# Patient Record
Sex: Male | Born: 1970 | Race: Black or African American | Hispanic: No | Marital: Married | Smoking: Never smoker
Health system: Southern US, Community
[De-identification: ages and names within clinical notes are randomized; demographics above are authoritative.]

## PROBLEM LIST (undated history)

## (undated) DIAGNOSIS — Z9889 Other specified postprocedural states: Secondary | ICD-10-CM

## (undated) DIAGNOSIS — A048 Other specified bacterial intestinal infections: Secondary | ICD-10-CM

## (undated) DIAGNOSIS — R112 Nausea with vomiting, unspecified: Secondary | ICD-10-CM

## (undated) DIAGNOSIS — F419 Anxiety disorder, unspecified: Secondary | ICD-10-CM

## (undated) DIAGNOSIS — F32A Depression, unspecified: Secondary | ICD-10-CM

## (undated) DIAGNOSIS — M722 Plantar fascial fibromatosis: Secondary | ICD-10-CM

## (undated) DIAGNOSIS — K589 Irritable bowel syndrome without diarrhea: Secondary | ICD-10-CM

## (undated) DIAGNOSIS — G4700 Insomnia, unspecified: Secondary | ICD-10-CM

## (undated) DIAGNOSIS — K602 Anal fissure, unspecified: Secondary | ICD-10-CM

## (undated) DIAGNOSIS — E785 Hyperlipidemia, unspecified: Secondary | ICD-10-CM

## (undated) DIAGNOSIS — Z8489 Family history of other specified conditions: Secondary | ICD-10-CM

## (undated) DIAGNOSIS — K317 Polyp of stomach and duodenum: Secondary | ICD-10-CM

## (undated) DIAGNOSIS — F329 Major depressive disorder, single episode, unspecified: Secondary | ICD-10-CM

## (undated) DIAGNOSIS — R51 Headache: Secondary | ICD-10-CM

## (undated) DIAGNOSIS — I10 Essential (primary) hypertension: Secondary | ICD-10-CM

## (undated) DIAGNOSIS — R7303 Prediabetes: Secondary | ICD-10-CM

## (undated) DIAGNOSIS — K219 Gastro-esophageal reflux disease without esophagitis: Secondary | ICD-10-CM

## (undated) HISTORY — DX: Prediabetes: R73.03

## (undated) HISTORY — PX: SPINE SURGERY: SHX786

## (undated) HISTORY — DX: Hyperlipidemia, unspecified: E78.5

## (undated) HISTORY — DX: Major depressive disorder, single episode, unspecified: F32.9

## (undated) HISTORY — PX: ROTATOR CUFF REPAIR: SHX139

## (undated) HISTORY — DX: Essential (primary) hypertension: I10

## (undated) HISTORY — DX: Anxiety disorder, unspecified: F41.9

## (undated) HISTORY — DX: Polyp of stomach and duodenum: K31.7

## (undated) HISTORY — PX: VASECTOMY: SHX75

## (undated) HISTORY — DX: Plantar fascial fibromatosis: M72.2

## (undated) HISTORY — PX: COLONOSCOPY: SHX174

## (undated) HISTORY — DX: Irritable bowel syndrome, unspecified: K58.9

## (undated) HISTORY — DX: Gastro-esophageal reflux disease without esophagitis: K21.9

## (undated) HISTORY — DX: Other specified bacterial intestinal infections: A04.8

## (undated) HISTORY — DX: Depression, unspecified: F32.A

## (undated) HISTORY — DX: Anal fissure, unspecified: K60.2

## (undated) HISTORY — DX: Insomnia, unspecified: G47.00

## (undated) HISTORY — DX: Headache: R51

---

## 1999-05-16 ENCOUNTER — Ambulatory Visit (HOSPITAL_BASED_OUTPATIENT_CLINIC_OR_DEPARTMENT_OTHER): Admission: RE | Admit: 1999-05-16 | Discharge: 1999-05-16 | Payer: Self-pay | Admitting: General Surgery

## 1999-06-28 ENCOUNTER — Ambulatory Visit (HOSPITAL_BASED_OUTPATIENT_CLINIC_OR_DEPARTMENT_OTHER): Admission: RE | Admit: 1999-06-28 | Discharge: 1999-06-28 | Payer: Self-pay | Admitting: General Surgery

## 2001-02-11 ENCOUNTER — Encounter (INDEPENDENT_AMBULATORY_CARE_PROVIDER_SITE_OTHER): Payer: Self-pay | Admitting: Specialist

## 2001-02-11 ENCOUNTER — Ambulatory Visit (HOSPITAL_COMMUNITY): Admission: RE | Admit: 2001-02-11 | Discharge: 2001-02-11 | Payer: Self-pay | Admitting: Urology

## 2001-09-08 HISTORY — PX: ANAL FISSURE REPAIR: SHX2312

## 2001-09-23 ENCOUNTER — Emergency Department (HOSPITAL_COMMUNITY): Admission: EM | Admit: 2001-09-23 | Discharge: 2001-09-23 | Payer: Self-pay | Admitting: Emergency Medicine

## 2004-01-23 ENCOUNTER — Emergency Department (HOSPITAL_COMMUNITY): Admission: EM | Admit: 2004-01-23 | Discharge: 2004-01-23 | Payer: Self-pay | Admitting: Emergency Medicine

## 2004-01-30 ENCOUNTER — Emergency Department (HOSPITAL_COMMUNITY): Admission: EM | Admit: 2004-01-30 | Discharge: 2004-01-30 | Payer: Self-pay | Admitting: Emergency Medicine

## 2005-03-08 DIAGNOSIS — K317 Polyp of stomach and duodenum: Secondary | ICD-10-CM

## 2005-03-08 HISTORY — DX: Polyp of stomach and duodenum: K31.7

## 2005-03-24 ENCOUNTER — Ambulatory Visit: Payer: Self-pay | Admitting: Internal Medicine

## 2005-03-31 ENCOUNTER — Ambulatory Visit: Payer: Self-pay | Admitting: Internal Medicine

## 2005-03-31 ENCOUNTER — Encounter (INDEPENDENT_AMBULATORY_CARE_PROVIDER_SITE_OTHER): Payer: Self-pay | Admitting: Specialist

## 2005-03-31 DIAGNOSIS — D131 Benign neoplasm of stomach: Secondary | ICD-10-CM | POA: Insufficient documentation

## 2007-01-08 ENCOUNTER — Emergency Department (HOSPITAL_COMMUNITY): Admission: EM | Admit: 2007-01-08 | Discharge: 2007-01-09 | Payer: Self-pay | Admitting: Emergency Medicine

## 2007-06-10 ENCOUNTER — Encounter: Payer: Self-pay | Admitting: Internal Medicine

## 2007-07-15 ENCOUNTER — Encounter: Payer: Self-pay | Admitting: Internal Medicine

## 2007-08-03 ENCOUNTER — Ambulatory Visit: Payer: Self-pay | Admitting: Internal Medicine

## 2007-09-29 DIAGNOSIS — F411 Generalized anxiety disorder: Secondary | ICD-10-CM | POA: Insufficient documentation

## 2007-09-29 DIAGNOSIS — K5900 Constipation, unspecified: Secondary | ICD-10-CM | POA: Insufficient documentation

## 2007-09-29 DIAGNOSIS — K589 Irritable bowel syndrome without diarrhea: Secondary | ICD-10-CM | POA: Insufficient documentation

## 2007-09-29 DIAGNOSIS — K602 Anal fissure, unspecified: Secondary | ICD-10-CM | POA: Insufficient documentation

## 2007-09-29 DIAGNOSIS — K3189 Other diseases of stomach and duodenum: Secondary | ICD-10-CM | POA: Insufficient documentation

## 2007-09-29 DIAGNOSIS — K625 Hemorrhage of anus and rectum: Secondary | ICD-10-CM | POA: Insufficient documentation

## 2007-09-29 DIAGNOSIS — R1013 Epigastric pain: Secondary | ICD-10-CM

## 2007-09-29 DIAGNOSIS — A048 Other specified bacterial intestinal infections: Secondary | ICD-10-CM | POA: Insufficient documentation

## 2007-09-29 DIAGNOSIS — K6289 Other specified diseases of anus and rectum: Secondary | ICD-10-CM | POA: Insufficient documentation

## 2007-10-19 ENCOUNTER — Ambulatory Visit: Payer: Self-pay | Admitting: Internal Medicine

## 2007-10-19 DIAGNOSIS — I10 Essential (primary) hypertension: Secondary | ICD-10-CM | POA: Insufficient documentation

## 2007-10-19 DIAGNOSIS — K219 Gastro-esophageal reflux disease without esophagitis: Secondary | ICD-10-CM | POA: Insufficient documentation

## 2007-11-11 ENCOUNTER — Ambulatory Visit: Payer: Self-pay | Admitting: Internal Medicine

## 2007-11-11 DIAGNOSIS — R42 Dizziness and giddiness: Secondary | ICD-10-CM | POA: Insufficient documentation

## 2007-11-11 HISTORY — DX: Dizziness and giddiness: R42

## 2007-11-15 ENCOUNTER — Telehealth: Payer: Self-pay | Admitting: Internal Medicine

## 2007-11-15 DIAGNOSIS — R0602 Shortness of breath: Secondary | ICD-10-CM | POA: Insufficient documentation

## 2007-11-23 ENCOUNTER — Encounter: Payer: Self-pay | Admitting: Internal Medicine

## 2007-11-23 ENCOUNTER — Ambulatory Visit: Payer: Self-pay

## 2007-11-25 ENCOUNTER — Ambulatory Visit: Payer: Self-pay | Admitting: Internal Medicine

## 2008-01-25 ENCOUNTER — Telehealth (INDEPENDENT_AMBULATORY_CARE_PROVIDER_SITE_OTHER): Payer: Self-pay | Admitting: *Deleted

## 2008-02-21 ENCOUNTER — Ambulatory Visit: Payer: Self-pay | Admitting: Internal Medicine

## 2008-02-21 ENCOUNTER — Encounter: Payer: Self-pay | Admitting: Internal Medicine

## 2008-02-21 DIAGNOSIS — J309 Allergic rhinitis, unspecified: Secondary | ICD-10-CM | POA: Insufficient documentation

## 2008-02-21 DIAGNOSIS — R21 Rash and other nonspecific skin eruption: Secondary | ICD-10-CM | POA: Insufficient documentation

## 2008-02-21 DIAGNOSIS — R079 Chest pain, unspecified: Secondary | ICD-10-CM | POA: Insufficient documentation

## 2008-07-31 ENCOUNTER — Ambulatory Visit: Payer: Self-pay | Admitting: Internal Medicine

## 2008-07-31 LAB — CONVERTED CEMR LAB
ALT: 28 units/L (ref 0–53)
AST: 24 units/L (ref 0–37)
Albumin: 4.6 g/dL (ref 3.5–5.2)
Alkaline Phosphatase: 49 units/L (ref 39–117)
BUN: 17 mg/dL (ref 6–23)
Basophils Absolute: 0.1 10*3/uL (ref 0.0–0.1)
Basophils Relative: 0.7 % (ref 0.0–3.0)
Bilirubin Urine: NEGATIVE
Bilirubin, Direct: 0.1 mg/dL (ref 0.0–0.3)
CO2: 31 meq/L (ref 19–32)
Calcium: 9.7 mg/dL (ref 8.4–10.5)
Chloride: 100 meq/L (ref 96–112)
Cholesterol: 184 mg/dL (ref 0–200)
Creatinine, Ser: 0.9 mg/dL (ref 0.4–1.5)
Eosinophils Absolute: 0.1 10*3/uL (ref 0.0–0.7)
Eosinophils Relative: 1.2 % (ref 0.0–5.0)
GFR calc Af Amer: 121 mL/min
GFR calc non Af Amer: 100 mL/min
Glucose, Bld: 97 mg/dL (ref 70–99)
HCT: 46.9 % (ref 39.0–52.0)
HDL: 29.3 mg/dL — ABNORMAL LOW (ref >39.0)
Hemoglobin, Urine: NEGATIVE
Hemoglobin: 16.2 g/dL (ref 13.0–17.0)
Ketones, ur: NEGATIVE mg/dL
LDL Cholesterol: 130 mg/dL — ABNORMAL HIGH (ref 0–99)
Leukocytes, UA: NEGATIVE
Lymphocytes Relative: 23.7 % (ref 12.0–46.0)
MCHC: 34.5 g/dL (ref 30.0–36.0)
MCV: 86.2 fL (ref 78.0–100.0)
Monocytes Absolute: 0.5 10*3/uL (ref 0.1–1.0)
Monocytes Relative: 6.7 % (ref 3.0–12.0)
Neutro Abs: 4.9 10*3/uL (ref 1.4–7.7)
Neutrophils Relative %: 67.7 % (ref 43.0–77.0)
Nitrite: NEGATIVE
Platelets: 237 10*3/uL (ref 150–400)
Potassium: 3.7 meq/L (ref 3.5–5.1)
RBC: 5.44 M/uL (ref 4.22–5.81)
RDW: 12.4 % (ref 11.5–14.6)
Sodium: 139 meq/L (ref 135–145)
Specific Gravity, Urine: 1.025 (ref 1.000–1.03)
TSH: 0.84 microintl units/mL (ref 0.35–5.50)
Total Bilirubin: 0.7 mg/dL (ref 0.3–1.2)
Total CHOL/HDL Ratio: 6.3
Total Protein, Urine: NEGATIVE mg/dL
Total Protein: 7.8 g/dL (ref 6.0–8.3)
Triglycerides: 122 mg/dL (ref 0–149)
Urine Glucose: NEGATIVE mg/dL
Urobilinogen, UA: 1 (ref 0.0–1.0)
VLDL: 24 mg/dL (ref 0–40)
WBC: 7.4 10*3/uL (ref 4.5–10.5)
pH: 5.5 (ref 5.0–8.0)

## 2008-11-14 ENCOUNTER — Ambulatory Visit: Payer: Self-pay | Admitting: Internal Medicine

## 2008-11-14 DIAGNOSIS — R930 Abnormal findings on diagnostic imaging of skull and head, not elsewhere classified: Secondary | ICD-10-CM | POA: Insufficient documentation

## 2008-11-14 DIAGNOSIS — M549 Dorsalgia, unspecified: Secondary | ICD-10-CM | POA: Insufficient documentation

## 2008-11-14 DIAGNOSIS — M722 Plantar fascial fibromatosis: Secondary | ICD-10-CM | POA: Insufficient documentation

## 2008-11-23 ENCOUNTER — Encounter: Payer: Self-pay | Admitting: Internal Medicine

## 2008-11-23 ENCOUNTER — Ambulatory Visit: Payer: Self-pay

## 2008-11-29 ENCOUNTER — Telehealth: Payer: Self-pay | Admitting: Internal Medicine

## 2009-06-08 ENCOUNTER — Telehealth: Payer: Self-pay | Admitting: Internal Medicine

## 2009-06-11 ENCOUNTER — Telehealth: Payer: Self-pay | Admitting: Internal Medicine

## 2009-10-10 ENCOUNTER — Ambulatory Visit: Payer: Self-pay | Admitting: Internal Medicine

## 2009-10-11 LAB — CONVERTED CEMR LAB
ALT: 39 units/L (ref 0–53)
AST: 24 units/L (ref 0–37)
Albumin: 4.7 g/dL (ref 3.5–5.2)
Alkaline Phosphatase: 53 units/L (ref 39–117)
BUN: 15 mg/dL (ref 6–23)
Basophils Absolute: 0 10*3/uL (ref 0.0–0.1)
Basophils Relative: 0.3 % (ref 0.0–3.0)
Bilirubin Urine: NEGATIVE
Bilirubin, Direct: 0.1 mg/dL (ref 0.0–0.3)
CO2: 32 meq/L (ref 19–32)
Calcium: 9.4 mg/dL (ref 8.4–10.5)
Chloride: 99 meq/L (ref 96–112)
Cholesterol: 199 mg/dL (ref 0–200)
Creatinine, Ser: 0.8 mg/dL (ref 0.4–1.5)
Eosinophils Absolute: 0.1 10*3/uL (ref 0.0–0.7)
Eosinophils Relative: 1.7 % (ref 0.0–5.0)
GFR calc non Af Amer: 137.72 mL/min (ref >60)
Glucose, Bld: 83 mg/dL (ref 70–99)
HCT: 47.3 % (ref 39.0–52.0)
HDL: 39.9 mg/dL (ref >39.00)
Hemoglobin, Urine: NEGATIVE
Hemoglobin: 16 g/dL (ref 13.0–17.0)
Ketones, ur: NEGATIVE mg/dL
LDL Cholesterol: 127 mg/dL — ABNORMAL HIGH (ref 0–99)
Leukocytes, UA: NEGATIVE
Lymphocytes Relative: 24.1 % (ref 12.0–46.0)
Lymphs Abs: 1.8 10*3/uL (ref 0.7–4.0)
MCHC: 33.7 g/dL (ref 30.0–36.0)
MCV: 88.8 fL (ref 78.0–100.0)
Monocytes Absolute: 0.7 10*3/uL (ref 0.1–1.0)
Monocytes Relative: 8.6 % (ref 3.0–12.0)
Neutro Abs: 5 10*3/uL (ref 1.4–7.7)
Neutrophils Relative %: 65.3 % (ref 43.0–77.0)
Nitrite: NEGATIVE
PSA: 0.68 ng/mL (ref 0.10–4.00)
Platelets: 241 10*3/uL (ref 150.0–400.0)
Potassium: 3.6 meq/L (ref 3.5–5.1)
RBC: 5.33 M/uL (ref 4.22–5.81)
RDW: 12.4 % (ref 11.5–14.6)
Sodium: 138 meq/L (ref 135–145)
Specific Gravity, Urine: 1.015 (ref 1.000–1.030)
TSH: 1.21 microintl units/mL (ref 0.35–5.50)
Total Bilirubin: 0.6 mg/dL (ref 0.3–1.2)
Total CHOL/HDL Ratio: 5
Total Protein, Urine: NEGATIVE mg/dL
Total Protein: 7.8 g/dL (ref 6.0–8.3)
Triglycerides: 160 mg/dL — ABNORMAL HIGH (ref 0.0–149.0)
Urine Glucose: NEGATIVE mg/dL
Urobilinogen, UA: 0.2 (ref 0.0–1.0)
VLDL: 32 mg/dL (ref 0.0–40.0)
WBC: 7.6 10*3/uL (ref 4.5–10.5)
pH: 5.5 (ref 5.0–8.0)

## 2009-10-12 ENCOUNTER — Telehealth: Payer: Self-pay | Admitting: Internal Medicine

## 2009-11-13 DIAGNOSIS — Z8711 Personal history of peptic ulcer disease: Secondary | ICD-10-CM | POA: Insufficient documentation

## 2009-12-06 ENCOUNTER — Telehealth: Payer: Self-pay | Admitting: Internal Medicine

## 2009-12-11 ENCOUNTER — Telehealth: Payer: Self-pay | Admitting: Internal Medicine

## 2010-01-14 ENCOUNTER — Telehealth: Payer: Self-pay | Admitting: Internal Medicine

## 2010-01-14 DIAGNOSIS — R51 Headache: Secondary | ICD-10-CM | POA: Insufficient documentation

## 2010-01-14 DIAGNOSIS — R519 Headache, unspecified: Secondary | ICD-10-CM | POA: Insufficient documentation

## 2010-03-06 ENCOUNTER — Encounter: Payer: Self-pay | Admitting: Internal Medicine

## 2010-04-08 ENCOUNTER — Encounter: Payer: Self-pay | Admitting: Internal Medicine

## 2010-04-30 ENCOUNTER — Ambulatory Visit: Payer: Self-pay | Admitting: Internal Medicine

## 2010-04-30 DIAGNOSIS — F3289 Other specified depressive episodes: Secondary | ICD-10-CM | POA: Insufficient documentation

## 2010-04-30 DIAGNOSIS — G4700 Insomnia, unspecified: Secondary | ICD-10-CM | POA: Insufficient documentation

## 2010-04-30 DIAGNOSIS — F329 Major depressive disorder, single episode, unspecified: Secondary | ICD-10-CM | POA: Insufficient documentation

## 2010-05-31 ENCOUNTER — Telehealth: Payer: Self-pay | Admitting: Internal Medicine

## 2010-07-25 ENCOUNTER — Telehealth: Payer: Self-pay | Admitting: Internal Medicine

## 2010-09-29 ENCOUNTER — Encounter: Payer: Self-pay | Admitting: Neurology

## 2010-10-02 ENCOUNTER — Telehealth: Payer: Self-pay | Admitting: Internal Medicine

## 2010-10-08 NOTE — Letter (Signed)
Summary: [facility]  [facility]   Imported By: Lester Idaville 03/12/2010 09:21:50  _____________________________________________________________________  External Attachment:    Type:   Image     Comment:   External Document

## 2010-10-08 NOTE — Progress Notes (Signed)
Summary: Xanax/Ambien  Phone Note Call from Patient   Caller: Patient [phone] Summary of Call: pt called requesting refills on Alprazolam and a new Rx for Ambien. pt states that he was prescribed Ambien by previous MD. [facility] Initial call taken by: Margaret Pyle, CMA,  December 06, 2009 9:18 AM  Follow-up for Phone Call        spoke with the pt, and informed him that rx's were faxed to pharmacy. Follow-up by: Margaret Pyle, CMA,  December 06, 2009 9:33 AM/ Sydell Axon SMA    New/Updated Medications: ALPRAZOLAM 0.5 MG  TABS (ALPRAZOLAM) 1 - 2 by mouth two times a day as needed ZOLPIDEM TARTRATE 10 MG TABS (ZOLPIDEM TARTRATE) 1po at bedtime as needed Prescriptions: ZOLPIDEM TARTRATE 10 MG TABS (ZOLPIDEM TARTRATE) 1po at bedtime as needed  #30 x 2   Entered and Authorized by:   Corwin Levins MD   Signed by:   Corwin Levins MD on 12/06/2009   Method used:   Print then Give to Patient   RxID:   [id number] ALPRAZOLAM 0.5 MG  TABS (ALPRAZOLAM) 1 - 2 by mouth two times a day as needed  #60 x 2   Entered and Authorized by:   Corwin Levins MD   Signed by:   Corwin Levins MD on 12/06/2009   Method used:   Print then Give to Patient   RxID:   [id number]  done hardcopy to LIM side B - dahlia Corwin Levins MD  December 06, 2009 9:28 AM

## 2010-10-08 NOTE — Assessment & Plan Note (Signed)
Summary: ANXIETY/NWS   Vital Signs:  Patient profile:   40 year old male Height:      71 inches Weight:      268 pounds BMI:     37.51 O2 Sat:      97 % on Room air Temp:     98.2 degrees F oral Pulse rate:   95 / minute BP sitting:   124 / 70  (left arm) Cuff size:   large  Vitals Entered By: Zella Ball Ewing CMA Duncan Dull) (April 30, 2010 2:17 PM)  O2 Flow:  Room air CC: Anxiety, sleeping problems/RE   CC:  Anxiety and sleeping problems/RE.  History of Present Illness: here to f/u - jsut had rort cuff surgury on the left through workman's comp- - Dr Blanchie Serve;  also recently told he had chronic tinnitus that keeps him on edge;  not worried about losing the job and no significnat fianancial issues, but seems to just not be able to go to sleep - seems to be frarful and even ? paranoid about going to sleep , ? fear of dying  but really cant tell why he feels fearful.  Only got one to 2 hrs sleep per night the last wk;  Just feels terrible and on edge.  No fever, wt loss, night sweats, loss of appetite or other constitutional symptoms  Had oxycontin and sleep pill but still unable to sleep, even with 10 mg zolpidem.  Has had some depression with the tinnitus, and trying to deal with it;  No suicidla ideation,  no panic attacks but "i've been close."  tends to pace at night.  alpraz .5 mg two times a day as needed helps some, but only taken it once in the past wk, worrying about addiction.    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 1 By Mouth Once Daily 2)  Prevacid 30 Mg  Cpdr (Lansoprazole) .Marland Kitchen.. 1 By Mouth Two Times A Day 3)  Alprazolam 0.5 Mg  Tabs (Alprazolam) .Marland Kitchen.. 1 - 2 By Mouth Two Times A Day As Needed 4)  Flexeril 5 Mg Tabs (Cyclobenzaprine Hcl) .Marland Kitchen.. 1 By Mouth Three Times A Day As Needed 5)  Zolpidem Tartrate 10 Mg Tabs (Zolpidem Tartrate) .Marland Kitchen.. 1po At Bedtime As Needed  Current Medications (verified): 1)  Lisinopril-Hydrochlorothiazide 20-25 Mg  Tabs (Lisinopril-Hydrochlorothiazide) .Marland Kitchen.. 1 By Mouth Once Daily 2)  Prevacid 30 Mg  Cpdr (Lansoprazole) .Marland Kitchen.. 1 By Mouth Two Times A Day 3)  Alprazolam 1 Mg Tabs (Alprazolam) .Marland Kitchen.. 1po Two Times A Day As Needed 4)  Flexeril 5 Mg Tabs (Cyclobenzaprine Hcl) .Marland Kitchen.. 1 By Mouth Three Times A Day As Needed 5)  Temazepam  30 Mg Caps (Temazepam) .Marland Kitchen.. 1po At Bedtime As Needed 6)  Lexapro 10 Mg Tabs (Escitalopram Oxalate) .Marland Kitchen.. 1po Once Daily  Allergies (verified): 1)  ! 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   Head:  normocephalic and atraumatic.   Eyes:  vision grossly intact, pupils equal, and pupils  round.   Ears:  R ear normal and L ear normal.   Nose:  no external deformity and no nasal discharge.   Mouth:  no gingival abnormalities and pharynx pink and moist.   Neck:  supple and no masses.   Lungs:  normal respiratory effort and normal breath sounds.   Heart:  normal rate and regular rhythm.   Extremities:  no edema, no erythema  Psych:  good eye contact and severely anxious.     Impression & Recommendations:  Problem # 1:  HYPERTENSION (ICD-401.9)  His updated medication list for this problem includes:    Lisinopril-hydrochlorothiazide 20-25 Mg Tabs (Lisinopril-hydrochlorothiazide) .Marland Kitchen... 1 by mouth once daily  BP today: 124/70 Prior BP: 114/72 (10/10/2009)  Labs Reviewed: K+: 3.6 (10/10/2009) Creat: : 0.8 (10/10/2009)   Chol: 199 (10/10/2009)   HDL: 39.90 (10/10/2009)   LDL: 127 (10/10/2009)   TG: 160.0 (10/10/2009) stable overall by hx and exam, ok to continue meds/tx as is   Problem # 2:  DEPRESSION (ICD-311)  His updated medication list for this problem includes:    Alprazolam 1 Mg Tabs (Alprazolam) .Marland Kitchen... 1po two times a day as needed    Lexapro 10 Mg Tabs (Escitalopram oxalate) .Marland Kitchen... 1po once daily add the lexapro once daily   Problem # 3:  ANXIETY (ICD-300.00)  His updated medication list for this problem includes:    Alprazolam 1 Mg Tabs (Alprazolam) .Marland Kitchen... 1po two times a day as needed    Lexapro 10 Mg Tabs (Escitalopram oxalate) .Marland Kitchen... 1po once daily incr the alpraz as above, as needed use  Problem # 4:  INSOMNIA-SLEEP DISORDER-UNSPEC (ICD-780.52)  His updated medication list for this problem includes:    Temazepam 30 Mg Caps (Temazepam) .Marland Kitchen... 1po at bedtime as needed treat as above, f/u any worsening signs or symptoms , ok to use the ambien as needed as well for the short term in the next wk only  Complete Medication List: 1)  Lisinopril-hydrochlorothiazide 20-25 Mg Tabs (Lisinopril-hydrochlorothiazide) .Marland Kitchen.. 1 by mouth once daily 2)  Prevacid 30 Mg Cpdr  (Lansoprazole) .Marland Kitchen.. 1 by mouth two times a day 3)  Alprazolam 1 Mg Tabs (Alprazolam) .Marland Kitchen.. 1po two times a day as needed 4)  Flexeril 5 Mg Tabs (Cyclobenzaprine hcl) .Marland Kitchen.. 1 by mouth three times a day as needed 5)  Temazepam 30 Mg Caps (Temazepam) .Marland Kitchen.. 1po at bedtime as needed 6)  Lexapro 10 Mg Tabs (Escitalopram oxalate) .Marland Kitchen.. 1po once daily  Patient Instructions: 1)  Please take all new medications as prescribed 2)  Continue all previous medications as before this visit  3)  Please schedule a follow-up appointment in Feb 2012 with CPX labs, or sooner if needed Prescriptions: ALPRAZOLAM 1 MG TABS (ALPRAZOLAM) 1po two times a  day as needed  #60 x 1   Entered and Authorized by:   Corwin Levins MD   Signed by:   Corwin Levins MD on 04/30/2010   Method used:   Print then Give to Patient   RxID:   [id number] TEMAZEPAM 30 MG CAPS (TEMAZEPAM) 1po at bedtime as needed  #30 x 0   Entered and Authorized by:   Corwin Levins MD   Signed by:   Corwin Levins MD on 04/30/2010   Method used:   Print then Give to Patient   RxID:   [id number] LEXAPRO 10 MG TABS (ESCITALOPRAM OXALATE) 1po once daily  #30 x 11   Entered and Authorized by:   Corwin Levins MD   Signed by:   Corwin Levins MD on 04/30/2010   Method used:   Print then Give to Patient   RxID:   [id number]

## 2010-10-08 NOTE — Progress Notes (Signed)
Summary: Referral?  Phone Note Call from Patient   Caller: Patient [phone] Summary of Call: pt called stating that he has been experiencing severe HA x 2 weeks. Pt says he was recently Dx with Tinnitis and does not know if this may be causing sxs. Pt is requesting referral to Neurologist. Please advise Initial call taken by: Margaret Pyle, CMA,  Jan 14, 2010 10:16 AM  Follow-up for Phone Call        ok for referral - neurology Follow-up by: Corwin Levins MD,  Jan 14, 2010 12:45 PM  Additional Follow-up for Phone Call Additional follow up Details #1::        pt informed via personal VM Additional Follow-up by: Margaret Pyle, CMA,  Jan 14, 2010 1:10 PM  New Problems: HEADACHE (ICD-784.0)   New Problems: HEADACHE (ICD-784.0)

## 2010-10-08 NOTE — Progress Notes (Signed)
Summary: med refill  Phone Note Refill Request Message from:  Fax from Pharmacy on May 31, 2010 4:15 PM  Refills Requested: Medication #1:  TEMAZEPAM 30 MG CAPS 1po at bedtime as needed   Dosage confirmed as above?Dosage Confirmed   Last Refilled: 04/30/2010   Notes: [facility] Initial call taken by: Robin Ewing CMA Duncan Dull),  May 31, 2010 4:15 PM    Prescriptions: TEMAZEPAM 30 MG CAPS (TEMAZEPAM) 1po at bedtime as needed  #30 x 3   Entered and Authorized by:   Corwin Levins MD   Signed by:   Corwin Levins MD on 05/31/2010   Method used:   Print then Give to Patient   RxID:   [id number]  done hardcopy to LIM side B - dahlia  Corwin Levins MD  May 31, 2010 6:04 PM   Rx faxed to pharmacy Margaret Pyle, CMA  June 03, 2010 8:08 AM

## 2010-10-08 NOTE — Assessment & Plan Note (Signed)
Summary: SORE THROAT /NWS   Vital Signs:  Patient profile:   40 year old male Height:      71 inches Weight:      283 pounds BMI:     39.61 O2 Sat:      97 % on Room air Temp:     98.6 degrees F oral Pulse rate:   84 / minute BP sitting:   114 / 72  (left arm) Cuff size:   large  Vitals Entered ByZella Ball Ewing (October 10, 2009 2:02 PM)  O2 Flow:  Room air  CC: sore throat/RE and wellness   CC:  sore throat/RE and wellness.  History of Present Illness: overall doing well, but incidently with ST for 3 days, now with worsening pain and pus on tonsils;  Pt denies CP, sob, doe, wheezing, orthopnea, pnd, worsening LE edema, palps, dizziness or syncope   Pt denies new neuro symptoms such as headache, facial or extremity weakness     Problems Prior to Update: 1)  Pharyngitis-acute  (ICD-462) 2)  Back Pain  (ICD-724.5) 3)  Skull/head Xray, Abnormal  (ICD-793.0) 4)  Plantar Fasciitis  (ICD-728.71) 5)  Preventive Health Care  (ICD-V70.0) 6)  Allergic Rhinitis  (ICD-477.9) 7)  Chest Pain  (ICD-786.50) 8)  Rash-nonvesicular  (ICD-782.1) 9)  Dyspnea  (ICD-786.05) 10)  Dizziness  (ICD-780.4) 11)  Gerd  (ICD-530.81) 12)  Hypertension  (ICD-401.9) 13)  Neoplasm, Benign, 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 1 By Mouth Once Daily 2)  Prevacid 30 Mg  Cpdr (Lansoprazole) .Marland Kitchen.. 1 By Mouth Two Times A Day 3)  Alprazolam 0.5 Mg  Tabs (Alprazolam) .Marland Kitchen.. 1 - 2 By Mouth Two Times A Day Prn 4)  Azithromycin 250 Mg Tabs (Azithromycin) .... 2po Qd For 1 Day, Then 1po Qd For 4days, Then Stop 5)  Flexeril 5 Mg Tabs  (Cyclobenzaprine Hcl) .Marland Kitchen.. 1 By Mouth Three Times A Day As Needed  Current Medications (verified): 1)  Lisinopril-Hydrochlorothiazide 20-25 Mg  Tabs (Lisinopril-Hydrochlorothiazide) .Marland Kitchen.. 1 By Mouth Once Daily 2)  Prevacid 30 Mg  Cpdr (Lansoprazole) .Marland Kitchen.. 1 By Mouth Two Times A Day 3)  Alprazolam 0.5 Mg  Tabs (Alprazolam) .Marland Kitchen.. 1 - 2 By Mouth Two Times A Day Prn 4)  Clarithromycin 500 Mg Tabs (Clarithromycin) .Marland Kitchen.. 1 By Mouth Two Times A Day 5)  Flexeril 5 Mg Tabs (Cyclobenzaprine Hcl) .Marland Kitchen.. 1 By Mouth Three Times A Day As Needed  Allergies (verified): 1)  ! 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         all otherwise negative per pt -   Physical Exam  General:  alert and overweight-appearing.  , mild ill  Head:  normocephalic and atraumatic.   Eyes:  vision grossly intact, pupils equal, and pupils round.   Ears:  bilat tm's red, sinus nontender Nose:  nasal dischargemucosal pallor and mucosal erythema.   Mouth:  pharyngeal erythema and fair dentition.  , tonsil with white exudate on right severe Neck:  supple and cervical  lymphadenopathy.   Lungs:  normal respiratory effort and normal breath sounds.   Heart:  normal rate and regular rhythm.   Abdomen:  soft, non-tender, and normal bowel sounds.   Msk:  no joint tenderness and no joint swelling.   Extremities:  no edema, no erythema  Neurologic:  cranial nerves II-XII intact and strength normal in all extremities.   Psych:  not anxious appearing and not depressed appearing.     Impression & Recommendations:  Problem # 1:  Preventive Health Care (ICD-V70.0)  Overall doing well, age appropriate education and counseling updated and referral for appropriate preventive services done unless declined, immunizations up to date or declined, diet counseling done if overweight, urged to quit smoking if smokes , most recent labs reviewed and current ordered if appropriate, ecg reviewed or declined (interpretation per ECG scanned in the EMR if done); information regarding Medicare Prevention requirements given if appropriate   Orders: TLB-BMP (Basic Metabolic Panel-BMET) (80048-METABOL) TLB-CBC Platelet - w/Differential (85025-CBCD) TLB-Hepatic/Liver Function Pnl (80076-HEPATIC) TLB-Lipid Panel (80061-LIPID) TLB-TSH (Thyroid Stimulating Hormone) (84443-TSH) TLB-PSA (Prostate Specific Antigen) (84153-PSA) TLB-Udip ONLY (81003-UDIP)  Problem # 2:  PHARYNGITIS-ACUTE (ICD-462)  His updated medication list for this problem includes:    Clarithromycin 500 Mg Tabs (Clarithromycin) .Marland Kitchen... 1 by mouth two times a day treat as above, f/u any worsening signs or symptoms   Complete Medication List: 1)  Lisinopril-hydrochlorothiazide 20-25 Mg Tabs (Lisinopril-hydrochlorothiazide) .Marland Kitchen.. 1 by mouth once daily 2)  Prevacid 30 Mg Cpdr (Lansoprazole) .Marland Kitchen.. 1 by mouth two times a day 3)  Alprazolam 0.5 Mg Tabs (Alprazolam) .Marland Kitchen.. 1 - 2 by mouth two times a day prn 4)  Clarithromycin 500 Mg Tabs (Clarithromycin) .Marland Kitchen.. 1 by mouth two times a day 5)  Flexeril 5 Mg Tabs (Cyclobenzaprine hcl)  .Marland Kitchen.. 1 by mouth three times a day as needed  Patient Instructions: 1)  Please take all new medications as prescribed 2)  Continue all previous medications as before this visiit 3)  Please go to the Lab in the basement for your blood and/or urine tests today 4)  Please schedule a follow-up appointment in 1 year or sooner if needed Prescriptions: CLARITHROMYCIN 500 MG TABS (CLARITHROMYCIN) 1 by mouth two times a day  #20 x 0   Entered and Authorized by:   Corwin Levins MD   Signed by:   Corwin Levins MD on 10/10/2009   Method used:   Print then Give to Patient   RxID:   [id number]

## 2010-10-08 NOTE — Letter (Signed)
Summary: [facility]  [facility]   Imported By: Lennie Odor 04/11/2010 11:10:01  _____________________________________________________________________  External Attachment:    Type:   Image     Comment:   External Document

## 2010-10-08 NOTE — Progress Notes (Signed)
Summary: medication refill  Phone Note Refill Request Message from:  Fax from Pharmacy on July 25, 2010 1:27 PM  Refills Requested: Medication #1:  ALPRAZOLAM 1 MG TABS 1po two times a day as needed   Dosage confirmed as above?Dosage Confirmed   Last Refilled: 04/30/2010   Notes[facility] [phone] Initial call taken by: Zella Ball Ewing CMA Duncan Dull),  July 25, 2010 1:28 PM  Follow-up for Phone Call        done hardcopy to LIM side B - dahlia  Follow-up by: Corwin Levins MD,  July 25, 2010 4:24 PM  Additional Follow-up for Phone Call Additional follow up Details #1::        Rx faxed to pharmacy Additional Follow-up by: Margaret Pyle, CMA,  July 25, 2010 4:27 PM    Prescriptions: ALPRAZOLAM 1 MG TABS (ALPRAZOLAM) 1po two times a day as needed  #60 x 1   Entered and Authorized by:   Corwin Levins MD   Signed by:   Corwin Levins MD on 07/25/2010   Method used:   Print then Give to Patient   RxID:   [id number]

## 2010-10-08 NOTE — Progress Notes (Signed)
  Phone Note Refill Request  on October 12, 2009 9:15 AM  Refills Requested: Medication #1:  LISINOPRIL-HYDROCHLOROTHIAZIDE 20-25 MG  TABS 1 by mouth once daily   Dosage confirmed as above?Dosage Confirmed   Notes: [facility] Initial call taken by: Scharlene Gloss,  October 12, 2009 9:15 AM    Prescriptions: LISINOPRIL-HYDROCHLOROTHIAZIDE 20-25 MG  TABS (LISINOPRIL-HYDROCHLOROTHIAZIDE) 1 by mouth once daily  #90 Tablet x 3   Entered by:   Scharlene Gloss   Authorized by:   Corwin Levins MD   Signed by:   Scharlene Gloss on 10/12/2009   Method used:   Faxed to ...       [facility] Delivery Fax) (mail-order)             ,          Ph: [phone]       Fax: [phone]   RxID:   [id number]

## 2010-10-08 NOTE — Progress Notes (Signed)
  Phone Note Refill Request  on December 11, 2009 11:52 AM  Refills Requested: Medication #1:  PREVACID 30 MG  CPDR 1 by mouth two times a day   Dosage confirmed as above?Dosage Confirmed   Notes: [facility] Initial call taken by: Scharlene Gloss,  December 11, 2009 11:52 AM    Prescriptions: PREVACID 30 MG  CPDR (LANSOPRAZOLE) 1 by mouth two times a day  #180 Capsule x 1   Entered by:   Scharlene Gloss   Authorized by:   Corwin Levins MD   Signed by:   Scharlene Gloss on 12/11/2009   Method used:   Faxed to ...       [facility] Environmental education officer)       [address] [address]       [place], [place]  [address]       Ph: [phone]       Fax: [phone]   RxID:   [id number]

## 2010-10-10 NOTE — Progress Notes (Signed)
Summary: Xanax  Phone Note Refill Request Message from:  Patient on October 02, 2010 11:28 AM  Refills Requested: Medication #1:  ALPRAZOLAM 1 MG TABS 1po two times a day as needed   Dosage confirmed as above?Dosage Confirmed   Supply Requested: 3 months   Notes: [facility]  Method Requested: Electronic Initial call taken by: Margaret Pyle, CMA,  October 02, 2010 11:29 AM    New/Updated Medications: ALPRAZOLAM 1 MG TABS (ALPRAZOLAM) 1po two times a day as needed Prescriptions: ALPRAZOLAM 1 MG TABS (ALPRAZOLAM) 1po two times a day as needed  #60 x 2   Entered and Authorized by:   Corwin Levins MD   Signed by:   Corwin Levins MD on 10/02/2010   Method used:   Print then Give to Patient   RxID:   [id number]  done hardcopy to LIM side B - dahlia Corwin Levins MD  October 02, 2010 1:10 PM   Rx faxed to pharmacy Margaret Pyle, CMA  October 02, 2010 1:47 PM

## 2010-10-15 ENCOUNTER — Other Ambulatory Visit: Payer: BC Managed Care – PPO

## 2010-10-15 ENCOUNTER — Encounter (INDEPENDENT_AMBULATORY_CARE_PROVIDER_SITE_OTHER): Payer: Self-pay | Admitting: *Deleted

## 2010-10-15 ENCOUNTER — Other Ambulatory Visit: Payer: Self-pay | Admitting: Internal Medicine

## 2010-10-15 DIAGNOSIS — Z0000 Encounter for general adult medical examination without abnormal findings: Secondary | ICD-10-CM

## 2010-10-15 DIAGNOSIS — Z1289 Encounter for screening for malignant neoplasm of other sites: Secondary | ICD-10-CM

## 2010-10-15 LAB — BASIC METABOLIC PANEL
BUN: 24 mg/dL — ABNORMAL HIGH (ref 6–23)
CO2: 28 mEq/L (ref 19–32)
Calcium: 9.4 mg/dL (ref 8.4–10.5)
Chloride: 101 mEq/L (ref 96–112)
Creatinine, Ser: 0.9 mg/dL (ref 0.4–1.5)
GFR: 116.61 mL/min (ref >60.00)
Glucose, Bld: 97 mg/dL (ref 70–99)
Potassium: 3.9 mEq/L (ref 3.5–5.1)
Sodium: 139 mEq/L (ref 135–145)

## 2010-10-15 LAB — TSH: TSH: 1 u[IU]/mL (ref 0.35–5.50)

## 2010-10-15 LAB — URINALYSIS
Bilirubin Urine: NEGATIVE
Hgb urine dipstick: NEGATIVE
Ketones, ur: NEGATIVE
Leukocytes, UA: NEGATIVE
Nitrite: NEGATIVE
Specific Gravity, Urine: 1.015 (ref 1.000–1.030)
Total Protein, Urine: NEGATIVE
Urine Glucose: NEGATIVE
Urobilinogen, UA: 0.2 (ref 0.0–1.0)
pH: 6 (ref 5.0–8.0)

## 2010-10-15 LAB — HEPATIC FUNCTION PANEL
ALT: 27 U/L (ref 0–53)
AST: 22 U/L (ref 0–37)
Albumin: 4.6 g/dL (ref 3.5–5.2)
Alkaline Phosphatase: 49 U/L (ref 39–117)
Bilirubin, Direct: 0.2 mg/dL (ref 0.0–0.3)
Total Bilirubin: 0.8 mg/dL (ref 0.3–1.2)
Total Protein: 7.4 g/dL (ref 6.0–8.3)

## 2010-10-15 LAB — CBC WITH DIFFERENTIAL/PLATELET
Basophils Absolute: 0 10*3/uL (ref 0.0–0.1)
Basophils Relative: 0.5 % (ref 0.0–3.0)
Eosinophils Absolute: 0.1 10*3/uL (ref 0.0–0.7)
Eosinophils Relative: 1.7 % (ref 0.0–5.0)
HCT: 44.2 % (ref 39.0–52.0)
Hemoglobin: 15.6 g/dL (ref 13.0–17.0)
Lymphocytes Relative: 23 % (ref 12.0–46.0)
Lymphs Abs: 1.6 10*3/uL (ref 0.7–4.0)
MCHC: 35.3 g/dL (ref 30.0–36.0)
MCV: 85.8 fl (ref 78.0–100.0)
Monocytes Absolute: 0.6 10*3/uL (ref 0.1–1.0)
Monocytes Relative: 8.5 % (ref 3.0–12.0)
Neutro Abs: 4.5 10*3/uL (ref 1.4–7.7)
Neutrophils Relative %: 66.3 % (ref 43.0–77.0)
Platelets: 210 10*3/uL (ref 150.0–400.0)
RBC: 5.14 Mil/uL (ref 4.22–5.81)
RDW: 13.4 % (ref 11.5–14.6)
WBC: 6.7 10*3/uL (ref 4.5–10.5)

## 2010-10-15 LAB — LIPID PANEL
Cholesterol: 188 mg/dL (ref 0–200)
HDL: 32.4 mg/dL — ABNORMAL LOW (ref >39.00)
LDL Cholesterol: 133 mg/dL — ABNORMAL HIGH (ref 0–99)
Total CHOL/HDL Ratio: 6
Triglycerides: 115 mg/dL (ref 0.0–149.0)
VLDL: 23 mg/dL (ref 0.0–40.0)

## 2010-10-15 LAB — PSA: PSA: 0.78 ng/mL (ref 0.10–4.00)

## 2010-10-21 ENCOUNTER — Encounter: Payer: Self-pay | Admitting: Internal Medicine

## 2010-10-21 ENCOUNTER — Encounter (INDEPENDENT_AMBULATORY_CARE_PROVIDER_SITE_OTHER): Payer: BC Managed Care – PPO | Admitting: Internal Medicine

## 2010-10-21 ENCOUNTER — Telehealth: Payer: Self-pay | Admitting: Internal Medicine

## 2010-10-21 DIAGNOSIS — Z0000 Encounter for general adult medical examination without abnormal findings: Secondary | ICD-10-CM

## 2010-10-23 ENCOUNTER — Telehealth: Payer: Self-pay | Admitting: Internal Medicine

## 2010-10-29 ENCOUNTER — Other Ambulatory Visit: Payer: Self-pay

## 2010-10-30 NOTE — Progress Notes (Signed)
  Phone Note Refill Request Message from:  Fax from Pharmacy on October 21, 2010 8:54 AM  Refills Requested: Medication #1:  PREVACID 30 MG  CPDR 1 by mouth two times a day   Dosage confirmed as above?Dosage Confirmed   Notes: [facility] Initial call taken by: Zella Ball Ewing CMA Duncan Dull),  October 21, 2010 8:54 AM    Prescriptions: PREVACID 30 MG  CPDR (LANSOPRAZOLE) 1 by mouth two times a day  #180 Capsule x 1   Entered by:   Scharlene Gloss CMA (AAMA)   Authorized by:   Corwin Levins MD   Signed by:   Scharlene Gloss CMA (AAMA) on 10/21/2010   Method used:   [facility] to ...       [facility] [address] [phone]* (retail)       [address]       [place], [place]  [address]       Ph: [phone] or [phone]       Fax: [phone]   RxID:   [id number]

## 2010-10-30 NOTE — Progress Notes (Signed)
Summary: PRESCRIPTIONS---  Phone Note Call from Patient   Caller: Patient Call For: Corwin Levins MD Summary of Call: Pt requesting prescriptions faxed to [facility]/Ph#:  [phone]---Pt's ph#:  [phone]--- Initial call taken by: Ivar Bury,  October 23, 2010 11:08 AM  Follow-up for Phone Call        pt will bring back hard copy of rx's given at 2/13 OV and rx's will be faxed to [facility] Follow-up by: Brenton Grills CMA Duncan Dull),  October 23, 2010 11:33 AM  Additional Follow-up for Phone Call Additional follow up Details #1::        Patient came by and dropped prescriptions off at office, [facility]. Additional Follow-up by: Robin Ewing CMA (AAMA),  October 23, 2010 2:06 PM

## 2010-11-05 ENCOUNTER — Encounter: Payer: Self-pay | Admitting: Internal Medicine

## 2010-11-05 NOTE — Assessment & Plan Note (Signed)
Summary: FEB PHY STC--moved up appt,ok'd/cd   Vital Signs:  Patient profile:   40 year old male Height:      71 inches Weight:      275.50 pounds BMI:     38.56 O2 Sat:      97 % on Room air Temp:     98.1 degrees F oral Pulse rate:   70 / minute BP sitting:   122 / 71  (left arm) Cuff size:   large  Vitals Entered By: Zella Ball Ewing CMA (AAMA) (October 21, 2010 3:02 PM)  O2 Flow:  Room air  Preventive Care Screening     declines tetanus  CC: Adult Physical/RE   CC:  Adult Physical/RE.  History of Present Illness: here for wellness, overall doing ok, looking forward to getting back to work after being out for several months;  Pt denies CP, worsening sob, doe, wheezing, orthopnea, pnd, worsening LE edema, palps, dizziness or syncope  Pt denies new neuro symptoms such as headache, facial or extremity weakness  Pt denies polydipsia, polyuria  Overall good compliance with meds, trying to follow low chol diet, wt stable, little excercise however . No fever, wt loss, night sweats, loss of appetite or other constitutional symptoms  Overall good compliance with meds, and good tolerability.  Denies worsening depressive symptoms, suicidal ideation, or panic.  Pt states good ability with ADL's, low fall risk, home safety reviewed and adequate, no significant change in hearing or vision, trying to follow lower chol diet, and occasionally active only with regular excercise.   Preventive Screening-Counseling & Management      Drug Use:  no.    Problems Prior to Update: 1)  Insomnia-sleep Disorder-unspec  (ICD-780.52) 2)  Depression  (ICD-311) 3)  Headache  (ICD-784.0) 4)  Helicobacter Pylori Infection, Hx of  (ICD-V12.71) 5)  Back Pain  (ICD-724.5) 6)  Skull/head Xray, Abnormal  (ICD-793.0) 7)  Plantar Fasciitis  (ICD-728.71) 8)  Preventive Health Care  (ICD-V70.0) 9)  Allergic Rhinitis  (ICD-477.9) 10)  Chest Pain  (ICD-786.50) 11)  Rash-nonvesicular  (ICD-782.1) 12)  Dyspnea   (ICD-786.05) 13)  Dizziness  (ICD-780.4) 14)  Gerd  (ICD-530.81) 15)  Hypertension  (ICD-401.9) 16)  Neoplasm, Benign, Stomach  (ICD-211.1) 17)  Anxiety  (ICD-300.00) 18)  Constipation  (ICD-564.00) 19)  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 1 By Mouth Once Daily 2)  Prevacid 30 Mg  Cpdr (Lansoprazole) .Marland Kitchen.. 1 By Mouth Two Times A Day 3)  Alprazolam 1 Mg Tabs (Alprazolam) .Marland Kitchen.. 1po Two Times A Day As Needed 4)  Flexeril 5 Mg Tabs (Cyclobenzaprine Hcl) .Marland Kitchen.. 1 By Mouth Three Times A Day As Needed 5)  Temazepam 30 Mg Caps (Temazepam) .Marland Kitchen.. 1po At Bedtime As Needed 6)  Lexapro 10 Mg Tabs (Escitalopram Oxalate) .Marland Kitchen.. 1po Once Daily  Current Medications (verified): 1)  Lisinopril-Hydrochlorothiazide 20-25 Mg  Tabs (Lisinopril-Hydrochlorothiazide) .Marland Kitchen.. 1 By Mouth Once Daily 2)  Prevacid 30 Mg  Cpdr (Lansoprazole) .Marland Kitchen.. 1 By Mouth Two Times A Day 3)  Alprazolam 1 Mg Tabs (Alprazolam) .Marland Kitchen.. 1po Two Times A Day As Needed 4)  Temazepam 30 Mg Caps (Temazepam) .Marland Kitchen.. 1po At Bedtime As Needed  Allergies (verified): 1)  ! 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  Drug use-no  Risk Factors: Smoking Status: never (10/19/2007)  Past Surgical History: anal fistula/2005 Rotator cuff repair left, aug 2011 - Dr Thomasena Edis  Social  History: Never Smoked Alcohol use-yes-occasional 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         all otherwise negative per pt -    Physical Exam  General:  alert and overweight-appearing.   Head:  normocephalic and atraumatic.   Eyes:  vision grossly intact, pupils equal, and pupils round.   Ears:  R ear normal and L ear normal.   Nose:  no external deformity and no nasal discharge.   Mouth:  no gingival abnormalities and pharynx pink and moist.   Neck:  supple and no masses.   Lungs:  normal respiratory effort and normal breath sounds.   Heart:  normal rate and regular rhythm.   Abdomen:  soft, non-tender, and normal bowel sounds.   Msk:  no joint tenderness and no joint swelling.   Extremities:  no edema, no erythema  Neurologic:  cranial nerves II-XII intact and strength normal in all extremities.   Skin:  color normal and no rashes.   Psych:  not depressed appearing and slightly anxious.     Impression & Recommendations:  Problem # 1:  Preventive Health Care (ICD-V70.0) Overall doing well, age appropriate education and counseling updated, referral for preventive services and immunizations addressed, dietary counseling and smoking status adressed , most recent labs reviewed, ecg reviewed I have personally reviewed and have noted 1.The patient's medical and social history 2.Their use of alcohol, tobacco or illicit drugs 3.Their current medications and supplements 4. Functional ability including ADL's,  fall risk, home safety risk, hearing & visual impairment  5.Diet and physical activities 6.Evidence for depression or mood disorders The patients weight, height, BMI  have been recorded in the chart I have made referrals, counseling and provided education to the patient based review of the above  Orders: EKG w/ Interpretation (93000)  Problem # 2:  HYPERTENSION (ICD-401.9)  His updated medication list for this problem includes:    Lisinopril-hydrochlorothiazide 20-25 Mg Tabs (Lisinopril-hydrochlorothiazide) .Marland Kitchen... 1 by mouth once daily  BP today: 122/71 Prior BP: 124/70 (04/30/2010)  Labs Reviewed: K+: 3.9 (10/15/2010) Creat: : 0.9 (10/15/2010)   Chol: 188 (10/15/2010)   HDL: 32.40 (10/15/2010)   LDL: 133 (10/15/2010)   TG: 115.0 (10/15/2010) stable overall by hx and exam, ok to continue meds/tx as is   Complete Medication List: 1)  Lisinopril-hydrochlorothiazide 20-25 Mg Tabs (Lisinopril-hydrochlorothiazide) .Marland Kitchen.. 1 by mouth once daily 2)  Prevacid 30 Mg Cpdr (Lansoprazole) .Marland Kitchen.. 1 by mouth two times a day 3)  Alprazolam 1 Mg Tabs (Alprazolam) .Marland Kitchen.. 1po two times a day as needed 4)  Temazepam 30 Mg Caps (Temazepam) .Marland Kitchen.. 1po at bedtime as needed  Patient Instructions: 1)  Continue all previous medications  as before this visit 2)  Your blood work was great today 3)  You are up to date on prevention 4)  Please follow lower cholesterol diet, get regular excercise and lose weight 5)  Please schedule a follow-up appointment in 1 year, or sooner if needed 6)  Good Luck getting back to work ! Prescriptions: TEMAZEPAM 30 MG CAPS (TEMAZEPAM) 1po at bedtime as needed  #90 x 1   Entered and Authorized by:   Corwin Levins MD   Signed by:   Corwin Levins MD on 10/21/2010   Method used:   Print then Give to Patient   RxID:   [id number] ALPRAZOLAM 1 MG TABS (ALPRAZOLAM) 1po two times a day as needed  #180 x 1   Entered and Authorized by:   Corwin Levins MD   Signed by:   Corwin Levins MD on  10/21/2010   Method used:   Print then Give to Patient   RxID:   [id number] PREVACID 30 MG  CPDR (LANSOPRAZOLE) 1 by mouth two times a day  #180 x 3   Entered and Authorized by:   Corwin Levins MD   Signed by:   Corwin Levins MD on 10/21/2010   Method used:   Print then Give to Patient   RxID:   [id number] LISINOPRIL-HYDROCHLOROTHIAZIDE 20-25 MG  TABS (LISINOPRIL-HYDROCHLOROTHIAZIDE) 1 by mouth once daily  #90 x 3   Entered and Authorized by:   Corwin Levins MD   Signed by:   Corwin Levins MD on 10/21/2010   Method used:   Print then Give to Patient   RxID:   [id number] LISINOPRIL-HYDROCHLOROTHIAZIDE 20-25 MG  TABS (LISINOPRIL-HYDROCHLOROTHIAZIDE) 1 by mouth once daily  #30 x 11   Entered and Authorized by:   Corwin Levins MD   Signed by:   Corwin Levins MD on 10/21/2010   Method used:   Print then Give to Patient   RxID:   [id number]    Orders Added: 1)  EKG w/ Interpretation [93000] 2)  Est. Patient 40-64 years [32202]

## 2011-01-21 NOTE — Assessment & Plan Note (Signed)
[facility]                         ELECTROPHYSIOLOGY OFFICE NOTE   [name], [name]                        MRN:          [id number]  DATE:11/25/2007                            DOB:          February 12, 1971    REASON FOR VISIT:  [name] is referred today by Dr. Jonny Ruiz for  evaluation of shortness of breath, chest pain and near-syncope with  extreme exertion.   HISTORY OF PRESENT ILLNESS:  The patient is a very pleasant 40 year old  male with hypertension whose health has been good.  He notes that when  he was doing push-ups with his son that he became dizzy and lightheaded  and had severe chest pain.  He stopped what he was doing and essentially  his symptoms resolved.  He did not have any frank syncope.  The patient  has had strenuous work in the past but has only recently bothered by any  of these symptoms.   PAST MEDICAL HISTORY:  1. Notable for oral surgery in the past.  2. Anal fissure surgery in 2006.  3. Known hypertension and has had that for many years.  4. Gastroesophageal reflux disease.   SOCIAL HISTORY:  The patient is married.  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  He denies tobacco abuse.  He  drinks 1-2 alcoholic beverages per month.  He drinks two to three cups  of coffee a day.  He has recently begun exercise program.   FAMILY HISTORY:  Notable for mother with hypertension and diabetes.  There is a question of coronary disease though we do not know this for  certain.  Father's health is otherwise good.  Review of systems is  notable for occasional headaches and dizziness as previously noted.  He  has hypertension.  Otherwise all systems were negative except as noted  in the HPI.   PHYSICAL EXAMINATION:  GENERAL:  He is a pleasant, well-appearing 78-  year-old man in no acute distress.  VITAL SIGNS:  Blood pressure was 112/84, pulse was 77 and regular,  respirations were 18.  Weight was 277  pounds.  HEENT:  Normocephalic, atraumatic.  Pupils equal, round.  Oropharynx  moist.  Sclerae anicteric.  NECK:  Revealed no jugular distention.  No thyromegaly.  Trachea is  midline.  Carotid 2+ and symmetric.  LUNGS:  Clear bilaterally auscultation.  No wheezes, rales or rhonchi  are present.  There is no increased work of breathing.  CARDIOVASCULAR:  Regular rate and rhythm.  Normal S1-S2.  There is no S3  rest for gallop.  The PMI was not enlarged nor was it laterally  displaced.  EXTREMITIES:  Demonstrated no cyanosis, clubbing or edema.  Pulses 2+  symmetric.  NEUROLOGIC:  Alert and oriented x3 with cranial nerves intact.  Strength  5/5 and symmetric.   STUDIES:  EKG demonstrates sinus rhythm with normal axis and intervals.   IMPRESSION:  1. Chest pain and shortness of breath with extreme exertion.  2. Dizziness and lightheadedness with extreme exertion.   DISCUSSION:  Mr. Cone's symptoms I think  are related to overexertion.  I have recommended he undergo exercise treadmill testing to make sure he  has no inducible ischemia or exercise-induced arrhythmia.  Otherwise, if  not, then he will be allowed to return back to exercise without  limitation.     Doylene Canning. Ladona Ridgel, MD  Electronically Signed    GWT/MedQ  DD: 11/25/2007  DT: 11/25/2007  Job #: [id number]

## 2011-01-21 NOTE — Assessment & Plan Note (Signed)
[facility]                         GASTROENTEROLOGY OFFICE NOTE   NAME:[name], [name]                        MRN:          [id number]  DATE:08/03/2007                            DOB:          10-08-1970    SUBJECTIVE:  [name] is a very nice 40 year old gentleman who we saw  in the past for dyspepsia and irritable bowel syndrome.  He underwent  upper endoscopy in July of 2006 with finding of positive H. Pylori tests  and he was treated for this.  He was recently again found to have a  positive H. Pylori antibody and was re-treated by Dr. Barbee Shropshire.  He is  here today as an acute walk-in because of rectal bleeding, rectal pain  which started one week ago.  He has a history of anal fissure which was  repaired in 2003.  He works as a Naval architect.He  has developed some  constipation and straining which resulted in rectal pain, tear and  bleeding.  Since he has started using Preparation H last week his  symptoms have significantly improved   MEDICATIONS:  1. Prevacid 30 mg p.o. daily.  2. Nexium 40 mg q.h.s.  3. Lisinopril/hydrochlorothiazide 20/25 one p.o. daily.   PHYSICAL EXAMINATION:  VITAL SIGNS:  Blood pressure 130/80, pulse 72 and  weight 290 pounds.  GENERAL APPEARANCE:  He looks in no distress.  SKIN:  Warm and dry.  LUNGS:  Clear to auscultation.  CARDIOVASCULAR:  Normal S1 and S2.  ABDOMEN:  Soft, nontender with normal active bowel sounds.  Liver edge  at costal margin.  RECTAL:  Anoscopic exam reveals normal rectal tone with an anal fissure  at 9 o'clock which was bleeding with the introduction of the anoscope.  There were also small circumferential hemorrhoids which were not  prolapsing.  They were mostly internal.  There were no external  hemorrhoids.  There was minimal discomfort with the rectal exam.  There  was an old anal fissure which was repaired and scar tissue was noted  around 9 o'clock.  The new fissure was next to the  repaired old anal  fissure.   IMPRESSION:  This is a 40 year old African-American male with recurrence  of anal fissure, most likely due to straining and sedentary job as a  Naval architect.   PLAN:  1. Anusol HC suppositories, one q.h.s. with two refills.  2. Analpram 2.5% samples given to use b.i.d. to t.i.d.  3. Booklet on anal fissure and hemorrhoids for patient's education.  4. Colace 100 mg daily.  5. Refill for Prevacid 30 mg p.o. b.i.d. with six refills.  6. I will see him on a p.r.n. basis.  I believe he does not need      referral to a surgeon.     Hedwig Morton. Juanda Chance, MD  Electronically Signed    DMB/MedQ  DD: 08/03/2007  DT: 08/03/2007  Job #: [id number]   cc:   Olene Craven, M.D.

## 2011-01-21 NOTE — Procedures (Signed)
[facility]                              EXERCISE TREADMILL   NAME:[name], [name]                        MRN:          [id number]  DATE:11/25/2007                            DOB:          Oct 25, 1970    INTRODUCTION:  The patient is a 40 year old male with a history of dizzy  spells, shortness of breath with exertion, and chest pressure who is now  referred for exercise treadmill test.   PROCEDURE:  After the patient was prepped in the usual manner, his blood  pressure was 130/82, heart rate was 83.  He walked on a Bruce protocol,  completing 11 minutes.  His heart rate increased from 83 beats per  minute up to 193 beats per minute.  His blood pressure increased from  130 up to 209/64.  The patient had no chest pain or shortness of breath.  He did become fatigued and had weakness in his legs and the test was  stopped secondary to leg weakness.  Review of the EKG segments demonstrated no diagnostic ST-T changes  consistent with ischemia.  In recovery there were no arrhythmias.   COMPLICATIONS:  There were no major complications.   RESULTS:  This study demonstrates a clinically and electrically negative  exercise treadmill test.  There was a hypertensive response to exercise.     Doylene Canning. Ladona Ridgel, MD  Electronically Signed    GWT/MedQ  DD: 11/25/2007  DT: 11/25/2007  Job #: [id number]   cc:   Corwin Levins, MD

## 2011-01-24 NOTE — Op Note (Signed)
[facility]  Patient:    [name], [name]                        MRN: [id number] Proc. Date: 02/11/01 Adm. Date:  [id number] Attending:  Laqueta Jean                           Operative Report  PREOPERATIVE DIAGNOSIS:  Elective sterilization.  POSTOPERATIVE DIAGNOSIS:  Elective sterilization.  OPERATION:  Vasectomy.  SURGEON:  Sigmund I. Patsi Sears, M.D.  ANESTHESIA:  General anesthesia (LMA)  PREPARATION:  After approperating roomiate preanesthesia, the patient was brought the opr and placed upon the operating table in the dorsosupine position where general anesthesia was introduced.  He remained in this position, and the penis was prepped with Betadine solution and draped in the usual sterile fashion.  DESCRIPTION OF PROCEDURE:  The vas was identified bilaterally, and the skin incisions were made measuring 1 cm each.  Subcutaneous tissue was then dissected, and the vas was isolated, doubly clamped, and a portion removed. ______ .   Each end was then sutured with 4-0 Monocryl suture.  Subcutaneous tissue and skin tissue closed with 4-0 Monocryl suture.  Sterile dressing was applied.  The patient was awakened and taken to the recovery room in good condition. DD:  02/11/01 TD:  02/11/01 Job: [id number]

## 2011-05-06 ENCOUNTER — Other Ambulatory Visit: Payer: Self-pay

## 2011-05-06 MED ORDER — ALPRAZOLAM 1 MG PO TABS: 1.0000 mg | ORAL_TABLET | Freq: Two times a day (BID) | ORAL | Status: DC | PRN

## 2011-05-06 NOTE — Telephone Encounter (Signed)
Faxed hardcopy to [facility] [phone]

## 2011-07-18 ENCOUNTER — Other Ambulatory Visit: Payer: Self-pay

## 2011-07-18 MED ORDER — ALPRAZOLAM 1 MG PO TABS: 1.0000 mg | ORAL_TABLET | Freq: Two times a day (BID) | ORAL | Status: DC | PRN

## 2011-07-18 NOTE — Telephone Encounter (Signed)
Done hardcopy to dahlia/LIM B  

## 2011-07-21 NOTE — Telephone Encounter (Signed)
Faxed hardcopy to [facility] at [phone]

## 2011-09-17 ENCOUNTER — Other Ambulatory Visit: Payer: Self-pay

## 2011-09-17 MED ORDER — LISINOPRIL-HYDROCHLOROTHIAZIDE 20-25 MG PO TABS: 1.0000 | ORAL_TABLET | Freq: Every day | ORAL | Status: DC

## 2011-12-18 ENCOUNTER — Telehealth: Payer: Self-pay | Admitting: Internal Medicine

## 2011-12-18 MED ORDER — LANSOPRAZOLE 30 MG PO CPDR: 30.0000 mg | DELAYED_RELEASE_CAPSULE | Freq: Every day | ORAL | Status: DC

## 2011-12-18 MED ORDER — ALPRAZOLAM 1 MG PO TABS: 1.0000 mg | ORAL_TABLET | Freq: Two times a day (BID) | ORAL | Status: DC | PRN

## 2011-12-18 NOTE — Telephone Encounter (Signed)
Prevacid sent per emr  Xanax - Done hardcopy to robin

## 2011-12-18 NOTE — Telephone Encounter (Signed)
Pt requesting lansoprazole and pt do not know name of second med he need only says help with anxiety--please call pt--- pt ph# [phone]

## 2011-12-18 NOTE — Telephone Encounter (Signed)
Patient informed and [facility] request

## 2011-12-25 ENCOUNTER — Telehealth: Payer: Self-pay

## 2011-12-25 MED ORDER — ALPRAZOLAM 1 MG PO TABS: 1.0000 mg | ORAL_TABLET | Freq: Two times a day (BID) | ORAL | Status: DC | PRN

## 2011-12-25 NOTE — Telephone Encounter (Signed)
Yes  rx re-done

## 2011-12-25 NOTE — Telephone Encounter (Signed)
[facility], never received xanax prescription sent in on 12/18/11. Please advise if ok to send again and fax to [phone]

## 2011-12-26 ENCOUNTER — Telehealth: Payer: Self-pay | Admitting: Internal Medicine

## 2011-12-26 NOTE — Telephone Encounter (Signed)
Spoke with patient and he states he has had rectal bleeding x 1 month off and on. He tried Prep H without much relief. Tuck's have helped some. Saw Dr. Juanda Chance in 2008 for rectal bleeding. Offered to schedule next week with extender. Scheduled patient on 12/31/11 at 8:30 AM with Willette Cluster, NP

## 2011-12-26 NOTE — Telephone Encounter (Signed)
[facility]... 12/26/11@8 :39am/LMB

## 2011-12-27 NOTE — Telephone Encounter (Signed)
I agree with him seeing a PA.

## 2011-12-29 ENCOUNTER — Ambulatory Visit: Payer: BC Managed Care – PPO | Admitting: Nurse Practitioner

## 2011-12-31 ENCOUNTER — Ambulatory Visit (INDEPENDENT_AMBULATORY_CARE_PROVIDER_SITE_OTHER): Payer: BC Managed Care – PPO | Admitting: Nurse Practitioner

## 2011-12-31 ENCOUNTER — Encounter: Payer: Self-pay | Admitting: Nurse Practitioner

## 2011-12-31 VITALS — BP 132/74 | HR 88 | Ht 71.0 in | Wt 289.0 lb

## 2011-12-31 DIAGNOSIS — K602 Anal fissure, unspecified: Secondary | ICD-10-CM

## 2011-12-31 MED ORDER — NITROGLYCERIN 0.4 % RE OINT: 1.0000 [in_us] | TOPICAL_OINTMENT | Freq: Two times a day (BID) | RECTAL | Status: DC

## 2011-12-31 NOTE — Progress Notes (Signed)
12/31/2011 CAS TRACZ [id number] 11-25-1970   HISTORY OF PRESENT ILLNESS: Patient is a 41 year old male followed in the past by Dr. Juanda Chance for history of dyspepsia and irritable bowel syndrome. Upper endoscopy July 2006 unremarkable except for a benign gastric polyp. He did have a positive H. Pylori test. Patient was last seen November 2008 and that was for evaluation of an anal fissure and hemorrhoids. Patient had doing well from a gastrointestinal standpoint until several months ago when he developed recurrent anal pain and bleeding. No constipation. Patient typically has more than one bowel movement a day that he has been trying to avoid defecation secondary to pain. Tried Prep H, helps temporarily.  Patient gives a history of fissure repair several years ago, he cannot remember details including who performed the surgery. No other gastrointestinal complaints. He should have a history of GERD, symptoms controlled on daily PPI  Past Medical History  Diagnosis Date  . IBS (irritable bowel syndrome)   . H. pylori infection   . Insomnia   . Depression   . Headache   . Plantar fasciitis   . Allergic rhinitis   . GERD (gastroesophageal reflux disease)   . Hypertension   . Anxiety   . Anal fissure   . Gastric polyp 03/2005    Benign   Past Surgical History  Procedure Date  . Anal fissure repair 2003  . Rotator cuff repair   . Vasectomy     reports that he has never smoked. He has never used smokeless tobacco. He reports that he does not drink alcohol or use illicit drugs. family history includes Diabetes in his mother and Hypertension in his mother. Allergies  Allergen Reactions  . Hydrocodone       Outpatient Encounter Prescriptions as of 12/31/2011  Medication Sig Dispense Refill  . lansoprazole (PREVACID) 30 MG capsule Take 1 capsule (30 mg total) by mouth daily.  90 capsule  1  . lisinopril-hydrochlorothiazide (PRINZIDE,ZESTORETIC) 20-25 MG per tablet Take 1 tablet by mouth  daily.  90 tablet  3  . naproxen sodium (ANAPROX) 220 MG tablet Take 220 mg by mouth 2 (two) times daily with a meal.      . DISCONTD: ALPRAZolam (XANAX) 1 MG tablet Take 1 tablet (1 mg total) by mouth 2 (two) times daily as needed for anxiety.  60 tablet  2    REVIEW OF SYSTEMS  : All other systems reviewed and negative except where noted in the History of Present Illness.   PHYSICAL EXAM: BP 132/74  Pulse 88  Ht 5\' 11"  (1.803 m)  Wt 289 lb (131.09 kg)  BMI 40.31 kg/m2 General: Well developed black male in no acute distress Head: Normocephalic and atraumatic Eyes:  sclerae anicteric,conjunctive pink. Ears: Normal auditory acuity Lungs: Clear throughout to auscultation Heart: Regular rate and rhythm Abdomen: Soft, non distended, nontender. No masses or hepatomegaly noted. Normal Bowel sounds Rectal: No external hemorrhoids or fissures. On anoscopy there was a superficial appearing posterior midline fissure. Musculoskeletal: Symmetrical with no gross deformities  Skin: No lesions on visible extremities Extremities: No edema or deformities noted Neurological: Alert oriented, grossly nonfocal Cervical Nodes:  No significant cervical adenopathy Psychological:  Alert and cooperative. Normal mood and affect  ASSESSMENT AND PLAN:  Anal fissure. Trial of Rectiv twice daily for two weeks. Patient should not continue to defer defecation because of associated discomfort .  Doing so may result in large, firm stools which may preclude fissure healing. Patient will call us in a  few days with a condition update

## 2011-12-31 NOTE — Patient Instructions (Signed)
We sent a prescription for the Rectiv ointment to [facility].  We have given you a $40.00 coupon to use. Do not strain to have a bowel movement.  Call us early next week with a progress report.  You can ask for Eryn Marandola at [phone].

## 2012-01-01 ENCOUNTER — Encounter: Payer: Self-pay | Admitting: Nurse Practitioner

## 2012-01-02 NOTE — Progress Notes (Signed)
I agree with assessment and plan.

## 2012-01-07 ENCOUNTER — Telehealth: Payer: Self-pay | Admitting: Nurse Practitioner

## 2012-01-07 NOTE — Telephone Encounter (Signed)
Patient states the Linna Darner is causing headaches. States he is still having some itching. Please, advise.

## 2012-01-07 NOTE — Telephone Encounter (Signed)
Left a message for patient to call me. 

## 2012-01-07 NOTE — Telephone Encounter (Signed)
Per Gunnar Fusi, Diltiazem gel 2% Use TID x 2 weeks. Rx called to [facility]. Patient states the bleeding has stopped. Reminded patient no straining and to have regular bowel movements.

## 2012-02-11 ENCOUNTER — Telehealth: Payer: Self-pay | Admitting: Nurse Practitioner

## 2012-02-11 NOTE — Telephone Encounter (Signed)
Left a a message for patient to call me.

## 2012-02-11 NOTE — Telephone Encounter (Signed)
Patient states he is still have some drainage. Not as much but still has it. He states he does not think the cream went "up in his rectum far enough to reach the fissure." He used Diltiazem gel 2 % TID. Please, advise if anything else he can try.

## 2012-02-13 NOTE — Telephone Encounter (Signed)
The ointment would have made it up far enough to treat the area but if still having problems we should recheck the area. If there is a fissure that is not healing he may need surgery. Thanks

## 2012-02-13 NOTE — Telephone Encounter (Signed)
Spoke with patient and scheduled OV on 02/17/12 at 8:30 AM with Willette Cluster, NP

## 2012-02-17 ENCOUNTER — Encounter: Payer: Self-pay | Admitting: Internal Medicine

## 2012-02-17 ENCOUNTER — Ambulatory Visit: Payer: BC Managed Care – PPO | Admitting: Nurse Practitioner

## 2012-02-17 ENCOUNTER — Encounter: Payer: Self-pay | Admitting: Physician Assistant

## 2012-02-17 ENCOUNTER — Ambulatory Visit (INDEPENDENT_AMBULATORY_CARE_PROVIDER_SITE_OTHER): Payer: BC Managed Care – PPO | Admitting: Physician Assistant

## 2012-02-17 VITALS — BP 128/72 | HR 88 | Ht 71.0 in | Wt 285.0 lb

## 2012-02-17 DIAGNOSIS — K625 Hemorrhage of anus and rectum: Secondary | ICD-10-CM

## 2012-02-17 DIAGNOSIS — Z1211 Encounter for screening for malignant neoplasm of colon: Secondary | ICD-10-CM

## 2012-02-17 DIAGNOSIS — K603 Anal fistula: Secondary | ICD-10-CM

## 2012-02-17 MED ORDER — MOVIPREP 100 G PO SOLR: ORAL | Status: DC

## 2012-02-17 NOTE — Progress Notes (Signed)
Did he get some Analpram cream 2.5% or a Nupricainal ointment to apply prior to his colonoscopy?

## 2012-02-17 NOTE — Progress Notes (Signed)
Subjective:    Patient ID: [name], male    DOB: 1971-02-01, 41 y.o.   MRN: [id number]  HPI [name] is a pleasant 41 year old African American male known to Dr. 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 Patient had his surgery here in [place] but cannot remember the surgeon's name. He was seen about 6 weeks ago here by Willette Cluster NP with recurrent complaints of rectal drainage and intermittent small-volume rectal bleeding. He states the drainage is mucoid sometimes pussy looking and has a foul odor intermittently. At this point he has had symptoms for the past 3-4 months. Currently he has no  complaints of rectal pain, no abdominal pain ,cramping, or change in his bowel habits and says that he has a bowel movement daily and generally has no issues with constipation.  he is wearing a pad because he has ongoing intermittent drainage from his rectum. On anoscopy previously he was felt to have an anal fissure, a trial of nitroglycerin ointment was given which gave him a headache and since he has been using diltiazem gel which he does not think is helping. Patient has not had any prior colonoscopic evaluation. Is family history is negative for colon cancer polyps and inflammatory bowel disease. Appendectomy also has chronic GERD and has been on Prevacid for the past 2 years. He has no complaints of dysphagia or odynophagia.    Review of Systems  Constitutional: Negative.   HENT: Negative.   Eyes: Negative.   Respiratory: Negative.   Cardiovascular: Negative.   Gastrointestinal: Positive for anal bleeding.  Genitourinary: Negative.   Musculoskeletal: Negative.   Neurological: Negative.   Hematological: Negative.   Psychiatric/Behavioral: Negative.    Outpatient Prescriptions Prior to Visit  Medication Sig Dispense Refill  . lansoprazole (PREVACID) 30 MG capsule Take 1 capsule (30 mg total) by mouth daily.  90 capsule  1  .  lisinopril-hydrochlorothiazide (PRINZIDE,ZESTORETIC) 20-25 MG per tablet Take 1 tablet by mouth daily.  90 tablet  3  . naproxen sodium (ANAPROX) 220 MG tablet Take 220 mg by mouth 2 (two) times daily with a meal.      . Nitroglycerin (RECTIV) 0.4 % OINT Place 1 inch rectally 2 (two) times daily.  1 Tube  0   Allergies  Allergen Reactions  . Hydrocodone    Patient Active Problem List  Diagnoses  . HELICOBACTER PYLORI INFECTION  . NEOPLASM, BENIGN, STOMACH  . ANXIETY  . DEPRESSION  . HYPERTENSION  . ALLERGIC RHINITIS  . GERD  . IRRITABLE BOWEL SYNDROME  . ANAL FISSURE  . RECTAL PAIN  . BACK PAIN  . PLANTAR FASCIITIS  . DIZZINESS  . INSOMNIA-SLEEP DISORDER-UNSPEC  . RASH-NONVESICULAR  . HEADACHE  . DYSPNEA  . CHEST PAIN  . SKULL/HEAD XRAY, ABNORMAL  . HELICOBACTER PYLORI INFECTION, HX OF   History   Social History  . Marital Status: Married    Spouse Name: N/A    Number of Children: 2  . Years of Education: N/A   Occupational History  . Truck Driver [employer]   Social History Main Topics  . Smoking status: Never Smoker   . Smokeless tobacco: Never Used  . Alcohol Use: No  . Drug Use: No  . Sexually Active: Not on file   Other Topics Concern  . Not on file   Social History Narrative  . No narrative on file       Objective:   Physical Exam  all well-developed healthy-appearing African American male in no acute distress, pleasant blood pressure 128/72 pulse 88 height 5 foot 11 weight 285. HEENT; nontraumatic normocephalic EOMI PERRLA's or anicteric,Neck; Supple no JVD, Cardiovascular; regular rate and rhythm with S1-S2 no murmur or gallop, Pulmonary; clear bilaterally, Abdomen ;soft nontender nondistended bowel sounds are active there is no palpable mass or hepatosplenomegaly,Rectal; no definite external fistulous opening identified, on anoscopy there appears to be a fistula with oozing of mucoid material at about the 10:00 position, he is nontender to exam. No stool  in the rectal vault., Extremities ;no clubbing cyanosis or edema skin warm and dry, Psych; mood and affect normal and appropriate.        Assessment & Plan:  #58 41 year old male with anal fistula with persistent symptoms x3-4 months. Patient has prior history of anal fistula with repair about 4 years ago He is having intermittent small amounts of hematochezia. Patient needs colonoscopy to further assess, and rule out IBD etc. I think he will need surgical intervention due to chronicity of his current symptoms. Plan; schedule for colonoscopy with Dr. Hermelinda Medicus was discussed in detail with the patient and he is agreeable to proceed. May stop diltiazem gel, Will hold on antibiotic treatment until further assessment with colonoscopy. Will arrange surgical consultation post colonoscopy. #2 Genella Rife- stable on Prevacid

## 2012-02-17 NOTE — Patient Instructions (Signed)
We have scheduled the Colonoscopy with Dr. Lina Sar.  Directions provided. We have faxed the prescription for the colonoscopy prep to [facility], [address].  We have given you a brochure for anal fistulas.  We will be making a referral to [facility] and will call you with the appointment.  [facility]'s number is [phone].

## 2012-02-18 ENCOUNTER — Telehealth: Payer: Self-pay | Admitting: *Deleted

## 2012-02-18 NOTE — Telephone Encounter (Signed)
I got a staff message from [place] and the pt has an appointment scheduled on 03-17-2012 @ 2:45 PM and she is to arrive at 2:15 PM.  I gave the pt this information today and also gave him the ph one number for [facility], [phone].

## 2012-02-23 ENCOUNTER — Ambulatory Visit: Payer: BC Managed Care – PPO | Admitting: Physician Assistant

## 2012-02-27 ENCOUNTER — Ambulatory Visit (AMBULATORY_SURGERY_CENTER): Payer: BC Managed Care – PPO | Admitting: Internal Medicine

## 2012-02-27 ENCOUNTER — Encounter: Payer: Self-pay | Admitting: Internal Medicine

## 2012-02-27 VITALS — BP 145/79 | HR 84 | Temp 97.9°F | Resp 18 | Ht 71.0 in | Wt 289.0 lb

## 2012-02-27 DIAGNOSIS — D126 Benign neoplasm of colon, unspecified: Secondary | ICD-10-CM

## 2012-02-27 DIAGNOSIS — K648 Other hemorrhoids: Secondary | ICD-10-CM

## 2012-02-27 DIAGNOSIS — K625 Hemorrhage of anus and rectum: Secondary | ICD-10-CM

## 2012-02-27 MED ORDER — HYDROCORTISONE ACETATE 25 MG RE SUPP: 25.0000 mg | Freq: Every evening | RECTAL | Status: AC | PRN

## 2012-02-27 MED ORDER — SODIUM CHLORIDE 0.9 % IV SOLN: 500.0000 mL | INTRAVENOUS | Status: DC

## 2012-02-27 NOTE — Op Note (Signed)
[facility] [address] [place], [place]  [address]  COLONOSCOPY PROCEDURE REPORT  PATIENT:  [name], [name]  MR#:  [id number] BIRTHDATE:  Aug 07, 1971, 41 yrs. old  GENDER:  male ENDOSCOPIST:  Hedwig Morton. Juanda Chance, MD REF. BY:  Oliver Barre, M.D. PROCEDURE DATE:  02/27/2012 PROCEDURE:  Colonoscopy 19147, Colonoscopy with biopsy ASA CLASS:  Class II INDICATIONS:  hematochezia hx of anal fissure repaired in 2003, recurrent rectal drainage MEDICATIONS:   MAC sedation, administered by CRNA, MAC sedation, administered by CRNA, propofol (Diprivan) 300 mg  DESCRIPTION OF PROCEDURE:   After the risks and benefits and of the procedure were explained, informed consent was obtained. Digital rectal exam was performed and revealed no rectal masses. The LB [id number]-[id number] endoscope was introduced through the anus and advanced to the terminal ileum which was intubated for a short distance.  The quality of the prep was good, using MoviPrep.  The instrument was then slowly withdrawn as the colon was fully examined. <<PROCEDUREIMAGES>>  FINDINGS:  Internal Hemorrhoids were found (see image10, image9, image8, image7, and image6). mixed hemorrgoids in the anal canal, no fissure or fistula  This was otherwise a normal examination of the colon. With standard forceps, biopsy was obtained and sent to pathology (see image4, image3, image2, and image1). biopsiea of the normal TI, random biopsies of the colon   Retroflexed views in the rectum revealed no abnormalities.    The scope was then withdrawn from the patient and the procedure completed.  COMPLICATIONS:  None ENDOSCOPIC IMPRESSION: 1) Internal hemorrhoids 2) Otherwise normal examination no evidence of a fissure or fistula, small mixed hemorrhoids RECOMMENDATIONS: 1) Await biopsy results Anusol HC supp 1 hs would hold off on surgical referral  REPEAT EXAM:  In 10 year(s) for.  ______________________________ Hedwig Morton. Juanda Chance,  MD  CC:  n. eSIGNED:   Hedwig Morton. Dallys Nowakowski at 02/27/2012 10:49 AM  Chriss Driver, [phone]

## 2012-02-27 NOTE — Patient Instructions (Signed)
YOU HAD AN ENDOSCOPIC PROCEDURE TODAY AT THE [facility]: Refer to the procedure report that was given to you for any specific questions about what was found during the examination.  If the procedure report does not answer your questions, please call your gastroenterologist to clarify.  If you requested that your care partner not be given the details of your procedure findings, then the procedure report has been included in a sealed envelope for you to review at your convenience later.  YOU SHOULD EXPECT: Some feelings of bloating in the abdomen. Passage of more gas than usual.  Walking can help get rid of the air that was put into your GI tract during the procedure and reduce the bloating. If you had a lower endoscopy (such as a colonoscopy or flexible sigmoidoscopy) you may notice spotting of blood in your stool or on the toilet paper. If you underwent a bowel prep for your procedure, then you may not have a normal bowel movement for a few days.  DIET: Your first meal following the procedure should be a light meal and then it is ok to progress to your normal diet.  A half-sandwich or bowl of soup is an example of a good first meal.  Heavy or fried foods are harder to digest and may make you feel nauseous or bloated.  Likewise meals heavy in dairy and vegetables can cause extra gas to form and this can also increase the bloating.  Drink plenty of fluids but you should avoid alcoholic beverages for 24 hours.  ACTIVITY: Your care partner should take you home directly after the procedure.  You should plan to take it easy, moving slowly for the rest of the day.  You can resume normal activity the day after the procedure however you should NOT DRIVE or use heavy machinery for 24 hours (because of the sedation medicines used during the test).    SYMPTOMS TO REPORT IMMEDIATELY: A gastroenterologist can be reached at any hour.  During normal business hours, 8:30 AM to 5:00 PM Monday through Friday,  call [phone].  After hours and on weekends, please call the GI answering service at [phone] who will take a message and have the physician on call contact you.   Following lower endoscopy (colonoscopy or flexible sigmoidoscopy):  Excessive amounts of blood in the stool  Significant tenderness or worsening of abdominal pains  Swelling of the abdomen that is new, acute  Fever of 100F or higher  Following upper endoscopy (EGD)  Vomiting of blood or coffee ground material  New chest pain or pain under the shoulder blades  Painful or persistently difficult swallowing  New shortness of breath  Fever of 100F or higher  Black, tarry-looking stools  FOLLOW UP: If any biopsies were taken you will be contacted by phone or by letter within the next 1-3 weeks.  Call your gastroenterologist if you have not heard about the biopsies in 3 weeks.  Our staff will call the home number listed on your records the next business day following your procedure to check on you and address any questions or concerns that you may have at that time regarding the information given to you following your procedure. This is a courtesy call and so if there is no answer at the home number and we have not heard from you through the emergency physician on call, we will assume that you have returned to your regular daily activities without incident.  SIGNATURES/CONFIDENTIALITY: You and/or your care   partner have signed paperwork which will be entered into your electronic medical record.  These signatures attest to the fact that that the information above on your After Visit Summary has been reviewed and is understood.  Full responsibility of the confidentiality of this discharge information lies with you and/or your care-partner.  

## 2012-02-27 NOTE — Progress Notes (Signed)
Patient did not experience any of the following events: a burn prior to discharge; a fall within the facility; wrong site/side/patient/procedure/implant event; or a hospital transfer or hospital admission upon discharge from the facility. (G8907) Patient did not have preoperative order for IV antibiotic SSI prophylaxis. (G8918)  

## 2012-03-01 ENCOUNTER — Telehealth: Payer: Self-pay | Admitting: *Deleted

## 2012-03-01 NOTE — Telephone Encounter (Signed)
  Follow up Call-  Call back number 02/27/2012  Post procedure Call Back phone  # [phone]  Permission to leave phone message Yes     Patient questions:  Do you have a fever, pain , or abdominal swelling? no Pain Score  0 *  Have you tolerated food without any problems? yes  Have you been able to return to your normal activities? yes  Do you have any questions about your discharge instructions: Diet   no Medications  no Follow up visit  no  Do you have questions or concerns about your Care? no  Actions: * If pain score is 4 or above: No action needed, pain <4.

## 2012-03-02 ENCOUNTER — Encounter: Payer: Self-pay | Admitting: Internal Medicine

## 2012-03-17 ENCOUNTER — Ambulatory Visit (INDEPENDENT_AMBULATORY_CARE_PROVIDER_SITE_OTHER): Payer: BC Managed Care – PPO | Admitting: General Surgery

## 2012-03-29 ENCOUNTER — Ambulatory Visit (INDEPENDENT_AMBULATORY_CARE_PROVIDER_SITE_OTHER): Payer: BC Managed Care – PPO | Admitting: General Surgery

## 2012-05-27 ENCOUNTER — Other Ambulatory Visit: Payer: Self-pay

## 2012-05-27 MED ORDER — LANSOPRAZOLE 30 MG PO CPDR: 30.0000 mg | DELAYED_RELEASE_CAPSULE | Freq: Every day | ORAL | Status: DC

## 2012-05-27 NOTE — Telephone Encounter (Signed)
Received refill request for the patients lansoprazole.  Called the patient to inform he would need to schedule OV with Dr. Jonny Ruiz as is overdue.  The patient agreed to do so and scheduled appt. 06/04/12.  Refilled the patients medication.

## 2012-06-04 ENCOUNTER — Other Ambulatory Visit (INDEPENDENT_AMBULATORY_CARE_PROVIDER_SITE_OTHER): Payer: BC Managed Care – PPO

## 2012-06-04 ENCOUNTER — Ambulatory Visit (INDEPENDENT_AMBULATORY_CARE_PROVIDER_SITE_OTHER): Payer: BC Managed Care – PPO | Admitting: Internal Medicine

## 2012-06-04 ENCOUNTER — Encounter: Payer: Self-pay | Admitting: Internal Medicine

## 2012-06-04 VITALS — BP 122/80 | HR 75 | Temp 97.7°F | Ht 71.0 in | Wt 286.4 lb

## 2012-06-04 DIAGNOSIS — Z0000 Encounter for general adult medical examination without abnormal findings: Secondary | ICD-10-CM

## 2012-06-04 DIAGNOSIS — E785 Hyperlipidemia, unspecified: Secondary | ICD-10-CM

## 2012-06-04 HISTORY — DX: Hyperlipidemia, unspecified: E78.5

## 2012-06-04 LAB — HEPATIC FUNCTION PANEL
ALT: 51 U/L (ref 0–53)
AST: 31 U/L (ref 0–37)
Albumin: 4.3 g/dL (ref 3.5–5.2)
Alkaline Phosphatase: 45 U/L (ref 39–117)
Bilirubin, Direct: 0.1 mg/dL (ref 0.0–0.3)
Total Bilirubin: 1 mg/dL (ref 0.3–1.2)
Total Protein: 7.2 g/dL (ref 6.0–8.3)

## 2012-06-04 LAB — LIPID PANEL
Cholesterol: 194 mg/dL (ref 0–200)
HDL: 32.1 mg/dL — ABNORMAL LOW (ref >39.00)
LDL Cholesterol: 140 mg/dL — ABNORMAL HIGH (ref 0–99)
Total CHOL/HDL Ratio: 6
Triglycerides: 109 mg/dL (ref 0.0–149.0)
VLDL: 21.8 mg/dL (ref 0.0–40.0)

## 2012-06-04 LAB — URINALYSIS, ROUTINE W REFLEX MICROSCOPIC
Bilirubin Urine: NEGATIVE
Hgb urine dipstick: NEGATIVE
Ketones, ur: NEGATIVE
Leukocytes, UA: NEGATIVE
Nitrite: NEGATIVE
Specific Gravity, Urine: 1.02 (ref 1.000–1.030)
Total Protein, Urine: NEGATIVE
Urine Glucose: NEGATIVE
Urobilinogen, UA: 0.2 (ref 0.0–1.0)
pH: 6 (ref 5.0–8.0)

## 2012-06-04 LAB — CBC WITH DIFFERENTIAL/PLATELET
Basophils Absolute: 0 10*3/uL (ref 0.0–0.1)
Basophils Relative: 0.4 % (ref 0.0–3.0)
Eosinophils Absolute: 0.2 10*3/uL (ref 0.0–0.7)
Eosinophils Relative: 2.4 % (ref 0.0–5.0)
HCT: 46 % (ref 39.0–52.0)
Hemoglobin: 15.5 g/dL (ref 13.0–17.0)
Lymphocytes Relative: 20.8 % (ref 12.0–46.0)
Lymphs Abs: 1.4 10*3/uL (ref 0.7–4.0)
MCHC: 33.6 g/dL (ref 30.0–36.0)
MCV: 86.7 fl (ref 78.0–100.0)
Monocytes Absolute: 0.7 10*3/uL (ref 0.1–1.0)
Monocytes Relative: 10.4 % (ref 3.0–12.0)
Neutro Abs: 4.3 10*3/uL (ref 1.4–7.7)
Neutrophils Relative %: 66 % (ref 43.0–77.0)
Platelets: 217 10*3/uL (ref 150.0–400.0)
RBC: 5.3 Mil/uL (ref 4.22–5.81)
RDW: 13.4 % (ref 11.5–14.6)
WBC: 6.5 10*3/uL (ref 4.5–10.5)

## 2012-06-04 LAB — TSH: TSH: 1.15 u[IU]/mL (ref 0.35–5.50)

## 2012-06-04 LAB — BASIC METABOLIC PANEL
BUN: 21 mg/dL (ref 6–23)
CO2: 28 mEq/L (ref 19–32)
Calcium: 9.2 mg/dL (ref 8.4–10.5)
Chloride: 98 mEq/L (ref 96–112)
Creatinine, Ser: 0.8 mg/dL (ref 0.4–1.5)
GFR: 135.94 mL/min (ref >60.00)
Glucose, Bld: 109 mg/dL — ABNORMAL HIGH (ref 70–99)
Potassium: 3.9 mEq/L (ref 3.5–5.1)
Sodium: 136 mEq/L (ref 135–145)

## 2012-06-04 LAB — PSA: PSA: 0.59 ng/mL (ref 0.10–4.00)

## 2012-06-04 MED ORDER — LISINOPRIL-HYDROCHLOROTHIAZIDE 20-25 MG PO TABS: 1.0000 | ORAL_TABLET | Freq: Every day | ORAL | Status: DC

## 2012-06-04 MED ORDER — ASPIRIN 81 MG PO TBEC: 81.0000 mg | DELAYED_RELEASE_TABLET | Freq: Every day | ORAL | Status: DC

## 2012-06-04 MED ORDER — ALPRAZOLAM 1 MG PO TABS: 1.0000 mg | ORAL_TABLET | Freq: Two times a day (BID) | ORAL | Status: DC | PRN

## 2012-06-04 MED ORDER — CYCLOBENZAPRINE HCL 5 MG PO TABS: 5.0000 mg | ORAL_TABLET | Freq: Three times a day (TID) | ORAL | Status: DC | PRN

## 2012-06-04 MED ORDER — LANSOPRAZOLE 30 MG PO CPDR: 30.0000 mg | DELAYED_RELEASE_CAPSULE | Freq: Every day | ORAL | Status: DC

## 2012-06-04 NOTE — Assessment & Plan Note (Signed)
Overall doing well, age appropriate education and counseling updated, referrals for preventative services and immunizations addressed, dietary and smoking counseling addressed, most recent labs and ECG reviewed.  I have personally reviewed and have noted: 1) the patient's medical and social history 2) The pt's use of alcohol, tobacco, and illicit drugs 3) The patient's current medications and supplements 4) Functional ability including ADL's, fall risk, home safety risk, hearing and visual impairment 5) Diet and physical activities 6) Evidence for depression or mood disorder 7) The patient's height, weight, and BMI have been recorded in the chart I have made referrals, and provided counseling and education based on review of the above Declines flu shot. For labs today

## 2012-06-04 NOTE — Patient Instructions (Addendum)
Please start Aspirin 81 mg - 1 per day - COATED  Only Continue all other medications as before [facility] for the x[facility]ax given in hardcopy Please go to LAB in the Basement for the blood and/or urine tests to be done today You will be contacted by phone if any changes need to be made immediately.  Otherwise, you will receive a letter about your results with an explanation. Please remember to sign up for My Chart at your earliest convenience, as this will be important to you in the future with finding out test results. Please continue your efforts at being more active, low cholesterol diet, and weight control. You are otherwise up to date with preventoion Please return in 1 year for your yearly visit, or sooner if needed, with Lab testing done 3-5 days before

## 2012-06-04 NOTE — Progress Notes (Signed)
Subjective:    Patient ID: [name], male    DOB: Jul 08, 1971, 41 y.o.   MRN: [id number]  HPI  Here for wellness and f/u;  Overall doing ok;  Pt denies CP, worsening SOB, DOE, wheezing, orthopnea, PND, worsening LE edema, palpitations, dizziness or syncope.  Pt denies neurological change such as new Headache, facial or extremity weakness.  Pt denies polydipsia, polyuria, or low sugar symptoms. Pt states overall good compliance with treatment and medications, good tolerability, and trying to follow lower cholesterol diet.  Pt denies worsening depressive symptoms, suicidal ideation or panic. No fever, wt loss, night sweats, loss of appetite, or other constitutional symptoms.  Pt states good ability with ADL's, low fall risk, home safety reviewed and adequate, no significant changes in hearing or vision, and occasionally active with exercise.  Has mild recurrent LBP , works for [employer], physical job.  Flexeril has worked in the past.  Also has  Chronic torn left rotater cuff x 2 yrs ago per pt, not resolved with PT after, wears arm sleeve since then. Past Medical History  Diagnosis Date  . IBS (irritable bowel syndrome)   . H. pylori infection   . Insomnia   . Depression   . Headache   . Plantar fasciitis   . Allergic rhinitis   . GERD (gastroesophageal reflux disease)   . Hypertension   . Anxiety   . Anal fissure   . Gastric polyp 03/2005    Benign  . Hyperlipidemia 06/04/2012   Past Surgical History  Procedure Date  . Anal fissure repair 2003  . Rotator cuff repair   . Vasectomy     reports that he has never smoked. He has never used smokeless tobacco. He reports that he drinks about .6 ounces of alcohol per week. He reports that he does not use illicit drugs. family history includes Diabetes in his mother and Hypertension in his mother. Allergies  Allergen Reactions  . Hydrocodone     Headache, hypersensitivity   Current Outpatient Prescriptions on File Prior to Visit  Medication  Sig Dispense Refill  . DISCONTD: lansoprazole (PREVACID) 30 MG capsule Take 1 capsule (30 mg total) by mouth daily.  90 capsule  0  . DISCONTD: lisinopril-hydrochlorothiazide (PRINZIDE,ZESTORETIC) 20-25 MG per tablet Take 1 tablet by mouth daily.  90 tablet  3   Current Facility-Administered Medications on File Prior to Visit  Medication Dose Route Frequency Provider Last Rate Last Dose  . DISCONTD: 0.9 %  sodium chloride infusion  500 mL Intravenous Continuous Hart Carwin, MD        Review of Systems Review of Systems  Constitutional: Negative for diaphoresis, activity change, appetite change and unexpected weight change.  HENT: Negative for hearing loss, ear pain, facial swelling, mouth sores and neck stiffness.   Eyes: Negative for pain, redness and visual disturbance.  Respiratory: Negative for shortness of breath and wheezing.   Cardiovascular: Negative for chest pain and palpitations.  Gastrointestinal: Negative for diarrhea, blood in stool, abdominal distention and rectal pain.  Genitourinary: Negative for hematuria, flank pain and decreased urine volume.  Musculoskeletal: Negative for myalgias and joint swelling.  Skin: Negative for color change and wound.  Neurological: Negative for syncope and numbness.  Hematological: Negative for adenopathy.  Psychiatric/Behavioral: Negative for hallucinations, self-injury, decreased concentration and agitation.      Objective:   Physical Exam BP 122/80  Pulse 75  Temp 97.7 F (36.5 C) (Oral)  Ht 5\' 11"  (1.803 m)  Wt 286  lb 6 oz (129.899 kg)  BMI 39.94 kg/m2  SpO2 97% Physical Exam  VS noted Constitutional: Pt is oriented to person, place, and time. Appears well-developed and well-nourished. Lavella Lemons HENT:  Head: Normocephalic and atraumatic.  Right Ear: External ear normal.  Left Ear: External ear normal.  Nose: Nose normal.  Mouth/Throat: Oropharynx is clear and moist.  Eyes: Conjunctivae and EOM are normal. Pupils are equal,  round, and reactive to light.  Neck: Normal range of motion. Neck supple. No JVD present. No tracheal deviation present.  Cardiovascular: Normal rate, regular rhythm, normal heart sounds and intact distal pulses.   Pulmonary/Chest: Effort normal and breath sounds normal.  Abdominal: Soft. Bowel sounds are normal. There is no tenderness.  Musculoskeletal: Normal range of motion. Exhibits no edema.  Lymphadenopathy:  Has no cervical adenopathy.  Neurological: Pt is alert and oriented to person, place, and time. Pt has normal reflexes. No cranial nerve deficit. Motor/dtr intact Skin: Skin is warm and dry. No rash noted.  Psychiatric:  Has  normal mood and affect. Behavior is normal.     Assessment & Plan:

## 2012-07-22 ENCOUNTER — Telehealth: Payer: Self-pay | Admitting: Internal Medicine

## 2012-07-22 NOTE — Telephone Encounter (Signed)
The record in EPIC does not list a prior sleep med  Does he recall the name?

## 2012-07-22 NOTE — Telephone Encounter (Signed)
There is no specific medicatoin for tinnitus  I would try mucinex otc bid prn, and allegra otc qd prn as well for the ear fullness

## 2012-07-22 NOTE — Telephone Encounter (Signed)
Patient Information:  Caller Name: Cassandra  Phone: [phone]  Patient: [name], [name]  Gender: Male  DOB: September 14, 1970  Age: 41 Years  PCP: Oliver Barre (Adults only)   Symptoms  Reason For Call & Symptoms: tinnitus  Reviewed Health History In EMR: Yes  Reviewed Medications In EMR: Yes  Reviewed Allergies In EMR: Yes  Date of Onset of Symptoms: Unknown  Guideline(s) Used:  Ear - Congestion  Hearing Loss  Disposition Per Guideline:   See Within 3 Days in Office  Reason For Disposition Reached:   Tinnitus (ringing, hissing, beating) and interferes with work, school, or sleep  Advice Given:  N/A  Office Follow Up:  Does the office need to follow up with this patient?: Yes  Instructions For The Office: Please consider renewal of medication for sleep due to tinnitus krs/can  Patient Refused Recommendation:  Patient Requests Prescription  Rx request sent krs/can  RN Note:  Declines appt; states had well visit 09/13.  Requests Rx be called in. krs/can

## 2012-07-22 NOTE — Telephone Encounter (Signed)
Called the patient informed of MD instructions.  The patient would like a refill on a previous prescription for sleeping sent to [facility] on [address] [place].

## 2012-07-23 MED ORDER — TEMAZEPAM 30 MG PO CAPS: 30.0000 mg | ORAL_CAPSULE | Freq: Every evening | ORAL | Status: DC | PRN

## 2012-07-23 NOTE — Telephone Encounter (Signed)
Done hardcopy to robin  

## 2012-07-23 NOTE — Telephone Encounter (Signed)
Called the patient and he stated it was temazepam

## 2012-07-23 NOTE — Telephone Encounter (Signed)
Patient informed and faxed hardcopy to [facility] [address] [place].

## 2012-07-30 ENCOUNTER — Other Ambulatory Visit: Payer: Self-pay

## 2012-07-30 MED ORDER — LANSOPRAZOLE 30 MG PO CPDR: 30.0000 mg | DELAYED_RELEASE_CAPSULE | Freq: Every day | ORAL | Status: DC

## 2012-08-02 ENCOUNTER — Other Ambulatory Visit: Payer: Self-pay

## 2012-08-02 NOTE — Telephone Encounter (Signed)
Pharmacy informed.

## 2012-08-02 NOTE — Telephone Encounter (Signed)
Xanax too soon - not due until late dec 2013

## 2012-08-17 ENCOUNTER — Telehealth: Payer: Self-pay

## 2012-08-17 MED ORDER — ALPRAZOLAM 1 MG PO TABS: 1.0000 mg | ORAL_TABLET | Freq: Two times a day (BID) | ORAL | Status: DC | PRN

## 2012-08-17 NOTE — Telephone Encounter (Signed)
Done hardcopy to robin  

## 2012-08-17 NOTE — Telephone Encounter (Signed)
The patient called to inform he did not realize a hardcopy of his Alprazolam was given to him at his 06/04/12 OV.  He states he threw the paperwork away unaware of what it was.  The patient would like this medication refilled if possible as he is out.  Please advise.

## 2012-08-17 NOTE — Telephone Encounter (Signed)
Called pt and let him know his rx was sent in for xanax.

## 2012-08-23 ENCOUNTER — Telehealth: Payer: Self-pay

## 2012-08-23 MED ORDER — ALPRAZOLAM 1 MG PO TABS: 1.0000 mg | ORAL_TABLET | Freq: Two times a day (BID) | ORAL | Status: DC | PRN

## 2012-08-23 NOTE — Telephone Encounter (Signed)
The patient has not received his mail order For Alprazolam yet.  The patient stated he uses it for tinnitus and is out.  He would like 3[facility] sent [facility] to [facility] [address] to hold him until mail order arrives.

## 2012-08-23 NOTE — Telephone Encounter (Signed)
Done hardcopy to robin  

## 2012-08-23 NOTE — Telephone Encounter (Signed)
Called the patient to inform faxed hardcopy to local pharmacy

## 2012-10-11 ENCOUNTER — Telehealth: Payer: Self-pay | Admitting: Internal Medicine

## 2012-10-11 NOTE — Telephone Encounter (Signed)
Requesting 30 day supply of Aprazolam be cancelled and 90 day supply be ordered from [facility] instead inorder to save on Copayment. He has a week and 1/2 of medication left.

## 2012-10-11 NOTE — Telephone Encounter (Signed)
Patient informed. 

## 2012-10-11 NOTE — Telephone Encounter (Signed)
Very sorry, but this is not normally done when the quantity would be so large (in his case #180) due to the controlled nature of the med

## 2012-10-25 ENCOUNTER — Telehealth: Payer: Self-pay

## 2012-10-25 MED ORDER — ALPRAZOLAM 1 MG PO TABS: 1.0000 mg | ORAL_TABLET | Freq: Two times a day (BID) | ORAL | Status: DC | PRN

## 2012-10-25 NOTE — Telephone Encounter (Signed)
Faxed hardcopy to pharmacy. 

## 2012-10-25 NOTE — Telephone Encounter (Signed)
The patient would like his alprazolam sent to local pharmacy and not mail order since unable to get a 90 day.[name] [address]

## 2012-10-25 NOTE — Telephone Encounter (Signed)
Done hardcopy to robin  

## 2013-04-25 ENCOUNTER — Other Ambulatory Visit: Payer: Self-pay

## 2013-04-25 MED ORDER — ALPRAZOLAM 1 MG PO TABS: 1.0000 mg | ORAL_TABLET | Freq: Two times a day (BID) | ORAL | Status: DC | PRN

## 2013-04-25 NOTE — Telephone Encounter (Signed)
Called left msg. To call back on pharmacy to send rx to.

## 2013-04-25 NOTE — Telephone Encounter (Signed)
Done hardcopy to robin  

## 2013-04-25 NOTE — Telephone Encounter (Signed)
Faxed hardcopy to [facility] 

## 2013-07-08 ENCOUNTER — Encounter: Payer: BC Managed Care – PPO | Admitting: Internal Medicine

## 2013-07-19 ENCOUNTER — Other Ambulatory Visit (INDEPENDENT_AMBULATORY_CARE_PROVIDER_SITE_OTHER): Payer: BC Managed Care – PPO

## 2013-07-19 ENCOUNTER — Encounter: Payer: Self-pay | Admitting: Internal Medicine

## 2013-07-19 ENCOUNTER — Ambulatory Visit (INDEPENDENT_AMBULATORY_CARE_PROVIDER_SITE_OTHER): Payer: BC Managed Care – PPO | Admitting: Internal Medicine

## 2013-07-19 ENCOUNTER — Ambulatory Visit: Payer: BC Managed Care – PPO

## 2013-07-19 VITALS — BP 128/80 | HR 76 | Temp 98.5°F | Ht 71.0 in | Wt 284.2 lb

## 2013-07-19 DIAGNOSIS — R7309 Other abnormal glucose: Secondary | ICD-10-CM

## 2013-07-19 DIAGNOSIS — F411 Generalized anxiety disorder: Secondary | ICD-10-CM

## 2013-07-19 DIAGNOSIS — Z0000 Encounter for general adult medical examination without abnormal findings: Secondary | ICD-10-CM

## 2013-07-19 DIAGNOSIS — H9319 Tinnitus, unspecified ear: Secondary | ICD-10-CM | POA: Insufficient documentation

## 2013-07-19 LAB — BASIC METABOLIC PANEL
BUN: 14 mg/dL (ref 6–23)
CO2: 28 mEq/L (ref 19–32)
Calcium: 9.4 mg/dL (ref 8.4–10.5)
Chloride: 100 mEq/L (ref 96–112)
Creatinine, Ser: 0.8 mg/dL (ref 0.4–1.5)
GFR: 139.23 mL/min (ref >60.00)
Glucose, Bld: 122 mg/dL — ABNORMAL HIGH (ref 70–99)
Potassium: 3.7 mEq/L (ref 3.5–5.1)
Sodium: 136 mEq/L (ref 135–145)

## 2013-07-19 LAB — TSH: TSH: 1.19 u[IU]/mL (ref 0.35–5.50)

## 2013-07-19 LAB — HEPATIC FUNCTION PANEL
ALT: 53 U/L (ref 0–53)
AST: 29 U/L (ref 0–37)
Albumin: 4.4 g/dL (ref 3.5–5.2)
Alkaline Phosphatase: 49 U/L (ref 39–117)
Bilirubin, Direct: 0.1 mg/dL (ref 0.0–0.3)
Total Bilirubin: 0.7 mg/dL (ref 0.3–1.2)
Total Protein: 7.2 g/dL (ref 6.0–8.3)

## 2013-07-19 LAB — CBC WITH DIFFERENTIAL/PLATELET
Basophils Absolute: 0 10*3/uL (ref 0.0–0.1)
Basophils Relative: 0.3 % (ref 0.0–3.0)
Eosinophils Absolute: 0.2 10*3/uL (ref 0.0–0.7)
Eosinophils Relative: 2.3 % (ref 0.0–5.0)
HCT: 45.7 % (ref 39.0–52.0)
Hemoglobin: 15.7 g/dL (ref 13.0–17.0)
Lymphocytes Relative: 20 % (ref 12.0–46.0)
Lymphs Abs: 1.3 10*3/uL (ref 0.7–4.0)
MCHC: 34.4 g/dL (ref 30.0–36.0)
MCV: 85.1 fl (ref 78.0–100.0)
Monocytes Absolute: 0.5 10*3/uL (ref 0.1–1.0)
Monocytes Relative: 7.9 % (ref 3.0–12.0)
Neutro Abs: 4.6 10*3/uL (ref 1.4–7.7)
Neutrophils Relative %: 69.5 % (ref 43.0–77.0)
Platelets: 220 10*3/uL (ref 150.0–400.0)
RBC: 5.37 Mil/uL (ref 4.22–5.81)
RDW: 13.6 % (ref 11.5–14.6)
WBC: 6.7 10*3/uL (ref 4.5–10.5)

## 2013-07-19 LAB — URINALYSIS, ROUTINE W REFLEX MICROSCOPIC
Bilirubin Urine: NEGATIVE
Hgb urine dipstick: NEGATIVE
Ketones, ur: NEGATIVE
Leukocytes, UA: NEGATIVE
Nitrite: NEGATIVE
Specific Gravity, Urine: 1.025 (ref 1.000–1.030)
Total Protein, Urine: NEGATIVE
Urine Glucose: NEGATIVE
Urobilinogen, UA: 0.2 (ref 0.0–1.0)
pH: 6 (ref 5.0–8.0)

## 2013-07-19 LAB — LIPID PANEL
Cholesterol: 193 mg/dL (ref 0–200)
HDL: 31.3 mg/dL — ABNORMAL LOW (ref >39.00)
LDL Cholesterol: 134 mg/dL — ABNORMAL HIGH (ref 0–99)
Total CHOL/HDL Ratio: 6
Triglycerides: 140 mg/dL (ref 0.0–149.0)
VLDL: 28 mg/dL (ref 0.0–40.0)

## 2013-07-19 LAB — HEMOGLOBIN A1C: Hgb A1c MFr Bld: 6.2 % (ref 4.6–6.5)

## 2013-07-19 LAB — PSA: PSA: 0.6 ng/mL (ref 0.10–4.00)

## 2013-07-19 MED ORDER — ALPRAZOLAM 1 MG PO TABS: 1.0000 mg | ORAL_TABLET | Freq: Two times a day (BID) | ORAL | Status: DC | PRN

## 2013-07-19 NOTE — Progress Notes (Signed)
Pre-visit discussion using our clinic review tool. No additional management support is needed unless otherwise documented below in the visit note.  

## 2013-07-19 NOTE — Assessment & Plan Note (Signed)

## 2013-07-19 NOTE — Patient Instructions (Addendum)
Your EKG was OK today Please continue all other medications as before, and refills have been done if requested. Please have the pharmacy call with any other refills you may need. Please continue your efforts at being more active, low cholesterol diet, and weight control. You are otherwise up to date with prevention measures today.  Please go to the LAB in the Basement (turn left off the elevator) for the tests to be done today You will be contacted by phone if any changes need to be made immediately.  Otherwise, you will receive a letter about your results with an explanation, but please check with MyChart first.  Please remember to sign up for My Chart if you have not done so, as this will be important to you in the future with finding out test results, communicating by private email, and scheduling acute appointments online when needed.  Please return in 1 year for your yearly visit, or sooner if needed, with Lab testing done 3-5 days before

## 2013-07-19 NOTE — Assessment & Plan Note (Signed)
stable overall by history and exam, and pt to continue medical treatment as before,  to f/u any worsening symptoms or concerns 

## 2013-07-19 NOTE — Progress Notes (Signed)
Subjective:    Patient ID: [name], male    DOB: [birth year], 42 y.o.   MRN: [id number]  HPI Here for wellness and f/u;  Overall doing ok;  Pt denies CP, worsening SOB, DOE, wheezing, orthopnea, PND, worsening LE edema, palpitations, dizziness or syncope.  Pt denies neurological change such as new headache, facial or extremity weakness.  Pt denies polydipsia, polyuria, or low sugar symptoms. Pt states overall good compliance with treatment and medications, good tolerability, and has been trying to follow lower cholesterol diet.  Pt denies worsening depressive symptoms, suicidal ideation or panic. No fever, night sweats, wt loss, loss of appetite, or other constitutional symptoms.  Pt states good ability with ADL's, has low fall risk, home safety reviewed and adequate, no other significant changes in hearing or vision, and only occasionally active with exercise. Works for [employer] and quite tired at the end of the day. Declines immunizations today  No new complaints. Plans to cont working on lower chol diet.  Asks for xanax refill, uses only very occas.  Mentions has large frame, used to bench 450 in High school, played football in college. Plans to try to lose 20 lbs this next yr as well, has to keep up with daughter now starting to play tennis Past Medical History  Diagnosis Date  . IBS (irritable bowel syndrome)   . H. pylori infection   . Insomnia   . Depression   . Headache(784.0)   . Plantar fasciitis   . Allergic rhinitis   . GERD (gastroesophageal reflux disease)   . Hypertension   . Anxiety   . Anal fissure   . Gastric polyp 03/2005    Benign  . Hyperlipidemia 06/04/2012   Past Surgical History  Procedure Laterality Date  . Anal fissure repair  2003  . Rotator cuff repair    . Vasectomy      reports that he has never smoked. He has never used smokeless tobacco. He reports that he drinks about 0.6 ounces of alcohol per week. He reports that he does not use illicit drugs. family  history includes Diabetes in his mother; Hypertension in his mother. Allergies  Allergen Reactions  . Hydrocodone     Headache, hypersensitivity   Current Outpatient Prescriptions on File Prior to Visit  Medication Sig Dispense Refill  . aspirin 81 MG EC tablet Take 1 tablet (81 mg total) by mouth daily. Swallow whole.  30 tablet  12  . cyclobenzaprine (FLEXERIL) 5 MG tablet Take 1 tablet (5 mg total) by mouth 3 (three) times daily as needed for muscle spasms.  90 tablet  2  . lansoprazole (PREVACID) 30 MG capsule Take 1 capsule (30 mg total) by mouth daily.  90 capsule  2  . lisinopril-hydrochlorothiazide (PRINZIDE,ZESTORETIC) 20-25 MG per tablet Take 1 tablet by mouth daily.  90 tablet  3   No current facility-administered medications on file prior to visit.    Review of Systems Constitutional: Negative for diaphoresis, activity change, appetite change or unexpected weight change.  HENT: Negative for hearing loss, ear pain, facial swelling, mouth sores and neck stiffness.   Eyes: Negative for pain, redness and visual disturbance.  Respiratory: Negative for shortness of breath and wheezing.   Cardiovascular: Negative for chest pain and palpitations.  Gastrointestinal: Negative for diarrhea, blood in stool, abdominal distention or other pain Genitourinary: Negative for hematuria, flank pain or change in urine volume.  Musculoskeletal: Negative for myalgias and joint swelling.  Skin: Negative for color change and  wound.  Neurological: Negative for syncope and numbness. other than noted Hematological: Negative for adenopathy.  Psychiatric/Behavioral: Negative for hallucinations, self-injury, decreased concentration and agitation.      Objective:   Physical Exam BP 128/80  Pulse 76  Temp(Src) 98.5 F (36.9 C) (Oral)  Ht 5\' 11"  (1.803 m)  Wt 284 lb 4 oz (128.935 kg)  BMI 39.66 kg/m2  SpO2 95% VS noted,  Constitutional: Pt is oriented to person, place, and time. Appears  well-developed and well-nourished. Lavella Lemons Head: Normocephalic and atraumatic.  Right Ear: External ear normal.  Left Ear: External ear normal.  Nose: Nose normal.  Mouth/Throat: Oropharynx is clear and moist.  Eyes: Conjunctivae and EOM are normal. Pupils are equal, round, and reactive to light.  Neck: Normal range of motion. Neck supple. No JVD present. No tracheal deviation present.  Cardiovascular: Normal rate, regular rhythm, normal heart sounds and intact distal pulses.   Pulmonary/Chest: Effort normal and breath sounds normal.  Abdominal: Soft. Bowel sounds are normal. There is no tenderness. No HSM  Musculoskeletal: Normal range of motion. Exhibits no edema.  Lymphadenopathy:  Has no cervical adenopathy.  Neurological: Pt is alert and oriented to person, place, and time. Pt has normal reflexes. No cranial nerve deficit.  Skin: Skin is warm and dry. No rash noted.  Psychiatric:  Has  normal mood and affect. Behavior is normal.     Assessment & Plan:

## 2013-08-16 ENCOUNTER — Other Ambulatory Visit: Payer: Self-pay | Admitting: Internal Medicine

## 2013-11-01 ENCOUNTER — Telehealth: Payer: Self-pay | Admitting: Internal Medicine

## 2013-11-01 MED ORDER — CYCLOBENZAPRINE HCL 5 MG PO TABS: 5.0000 mg | ORAL_TABLET | Freq: Three times a day (TID) | ORAL | Status: DC | PRN

## 2013-11-01 NOTE — Telephone Encounter (Signed)
Pt request refill for Flexerill to be send to [facility]. Please advise.

## 2013-11-02 ENCOUNTER — Telehealth: Payer: Self-pay | Admitting: *Deleted

## 2013-11-02 ENCOUNTER — Telehealth: Payer: Self-pay | Admitting: Optometry

## 2013-11-02 MED ORDER — CYCLOBENZAPRINE HCL 5 MG PO TABS
5.0000 mg | ORAL_TABLET | Freq: Three times a day (TID) | ORAL | Status: DC | PRN
Start: 2013-11-02 — End: 2013-11-02

## 2013-11-02 MED ORDER — CYCLOBENZAPRINE HCL 5 MG PO TABS: 5.0000 mg | ORAL_TABLET | Freq: Three times a day (TID) | ORAL | Status: DC | PRN

## 2013-11-02 NOTE — Telephone Encounter (Signed)
Script resent and have spoken to patient regarding script this morning.

## 2013-11-02 NOTE — Telephone Encounter (Signed)
Pt phoned stating pharmacy wasn't receiving flexeril script-resent & notified patient.

## 2013-11-02 NOTE — Telephone Encounter (Signed)
Patient is calling because his pharmacy did not receive his rx for cyclobenzaprine (FLEXERIL) 5 MG tablet. Please re-send.

## 2013-11-23 ENCOUNTER — Telehealth: Payer: Self-pay | Admitting: *Deleted

## 2013-11-23 MED ORDER — SCOPOLAMINE 1 MG/3DAYS TD PT72: 1.0000 | MEDICATED_PATCH | TRANSDERMAL | Status: DC

## 2013-11-23 NOTE — Telephone Encounter (Signed)
Done erx 

## 2013-11-23 NOTE — Telephone Encounter (Signed)
Pt called requesting a Scopolomine Patch Rx.  States he is going on a cruise next month.  Please advise

## 2013-11-24 NOTE — Telephone Encounter (Signed)
Spoke with pt advised Rx sent 

## 2013-11-29 ENCOUNTER — Other Ambulatory Visit: Payer: Self-pay | Admitting: Internal Medicine

## 2013-11-29 NOTE — Telephone Encounter (Signed)
Done hardcopy to robin  

## 2013-11-29 NOTE — Telephone Encounter (Signed)
Faxed hardcopy to [facility]

## 2014-01-27 DIAGNOSIS — Z0279 Encounter for issue of other medical certificate: Secondary | ICD-10-CM

## 2014-03-09 ENCOUNTER — Other Ambulatory Visit: Payer: Self-pay | Admitting: *Deleted

## 2014-03-09 MED ORDER — LISINOPRIL-HYDROCHLOROTHIAZIDE 20-25 MG PO TABS: ORAL_TABLET | ORAL | Status: DC

## 2014-03-09 NOTE — Telephone Encounter (Signed)
Requesting refill on his lisinopril. Inform pt will send to [facility]...[name]

## 2014-05-29 ENCOUNTER — Other Ambulatory Visit: Payer: Self-pay | Admitting: Internal Medicine

## 2014-05-30 NOTE — Telephone Encounter (Signed)
Faxed hardcopy for alprazolam to [facility] [address] [place]

## 2014-05-30 NOTE — Telephone Encounter (Signed)
Done hardcopy to robin  

## 2014-07-06 ENCOUNTER — Encounter: Payer: Self-pay | Admitting: Podiatry

## 2014-07-06 ENCOUNTER — Ambulatory Visit: Payer: BC Managed Care – PPO | Admitting: Podiatry

## 2014-07-06 VITALS — BP 128/82 | HR 102 | Resp 16 | Ht 71.0 in | Wt 272.0 lb

## 2014-07-06 DIAGNOSIS — L600 Ingrowing nail: Secondary | ICD-10-CM

## 2014-07-06 MED ORDER — NEOMYCIN-POLYMYXIN-HC 3.5-10000-1 OT SOLN: OTIC | Status: DC

## 2014-07-06 NOTE — Patient Instructions (Signed)

## 2014-07-06 NOTE — Progress Notes (Signed)
He presents today for follow-up. He states that the total matrixectomy that we performed last year has allow the nail to grow back, is much smaller but yet is still back and tender.  Objective: Vital signs are stable he is alert and oriented 3. Painful hallux nail left foot.  Assessment: Painful hallux nail left.  Plan: Total nail avulsion with matrixectomy hallux left he tolerated procedure well after 3 mL of a 50-50 mixture of Marcaine plain lidocaine plain was infiltrated in a hallux block left. The nail was prepped and draped in the normal sterile fashion. The nail was avulsed exposing the matrix of the nailbed which were destroyed utilizing 3 applications of phenol 30 seconds each. Phenol was then utilized with isopropyl alcohol and Silvadene cream to address a compressive dressing was applied. He was reminded of his soaking instructions and re-dispensed a prescription for Cortisporin Otic. I will follow-up with him in 1 week just to make sure this is healing well.

## 2014-07-13 ENCOUNTER — Ambulatory Visit: Payer: BC Managed Care – PPO | Admitting: Podiatry

## 2014-07-18 ENCOUNTER — Other Ambulatory Visit: Payer: Self-pay

## 2014-07-18 MED ORDER — LISINOPRIL-HYDROCHLOROTHIAZIDE 20-25 MG PO TABS: ORAL_TABLET | ORAL | Status: DC

## 2014-07-18 MED ORDER — LANSOPRAZOLE 30 MG PO CPDR: 30.0000 mg | DELAYED_RELEASE_CAPSULE | Freq: Every day | ORAL | Status: DC

## 2014-10-24 ENCOUNTER — Ambulatory Visit: Payer: Self-pay | Admitting: Dietician

## 2014-10-31 ENCOUNTER — Other Ambulatory Visit: Payer: Self-pay | Admitting: Internal Medicine

## 2014-11-06 ENCOUNTER — Encounter: Payer: Self-pay | Admitting: Dietician

## 2014-11-06 ENCOUNTER — Encounter: Payer: BLUE CROSS/BLUE SHIELD | Attending: Family Medicine | Admitting: Dietician

## 2014-11-06 VITALS — Ht 71.0 in | Wt 278.0 lb

## 2014-11-06 DIAGNOSIS — R7309 Other abnormal glucose: Secondary | ICD-10-CM | POA: Diagnosis not present

## 2014-11-06 DIAGNOSIS — Z713 Dietary counseling and surveillance: Secondary | ICD-10-CM | POA: Diagnosis not present

## 2014-11-06 DIAGNOSIS — R7303 Prediabetes: Secondary | ICD-10-CM

## 2014-11-06 NOTE — Progress Notes (Signed)
  Medical Nutrition Therapy:  Appt start time: 3810 end time:  1600.   Assessment:  Primary concerns today: Patient has been diagnosed with pre diabetes and would like to learn more about nutrition to get better control of his blood sugar.  He is here with his wife who is a Marine scientist.  Patient has lost from 287.8 lbs 09/18/14 to 278 lbs today.  Complains of being very hungry with the changes that he has made.  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  Reports increased stress due to parents living in [place] with terminal cancer.  Preferred Learning Style:   No preference indicated   Learning Readiness:   Ready  Change in progress  MEDICATIONS: see list   DIETARY INTAKE: Patient has changed many things about his diet since being diagnosed with pre diabetes.  He has eliminated regular tea and soda, stopped using fried foods and ate more salads.  States that he has started to become bored by lack of variety.  Usual eating pattern includes 3 meals and 3 snacks per day. 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  Owns treadmill but does not use.    Estimated energy needs: 2100 calories 235 g carbohydrates 158 g protein 58 g fat  Progress Towards Goal(s):  In progress.   Nutritional Diagnosis:  NB-1.1 Food and nutrition-related knowledge deficit As related to balance of carbohydrates, proteins, and fats.  As evidenced by patient report..    Intervention:   Nutrition counseling and diabetes education initiated. Discussed Carb Counting by food group as method of portion control, reading food labels, and benefits of increased activity.  Plan:  Aim for 4-5 Carb Choices per meal (60-75 grams) +/- 1 either way  Aim for 0-2 Carbs per snack if hungry  Include protein in moderation with your meals and snacks Consider reading food labels for Total Carbohydrate and Fat Grams of foods Consider  increasing your activity level by walking for 30 minutes 5 days per week. Keep up the good work!  Teaching Method Utilized:  Visual Auditory Hands on  Handouts given during visit include:  Meal plan card  Label reading  Snack list  Barriers to learning/adherence to lifestyle change: none  Demonstrated degree of understanding via:  Teach Back   Monitoring/Evaluation:  Dietary intake, exercise, label reading, and body weight prn.

## 2014-11-06 NOTE — Patient Instructions (Signed)
Plan:  Aim for 4-5 Carb Choices per meal (60-75 grams) +/- 1 either way  Aim for 0-2 Carbs per snack if hungry  Include protein in moderation with your meals and snacks Consider reading food labels for Total Carbohydrate and Fat Grams of foods Consider  increasing your activity level by walking for 30 minutes 5 days per week. Keep up the good work!

## 2015-04-06 ENCOUNTER — Encounter: Payer: Self-pay | Admitting: Internal Medicine

## 2015-06-06 ENCOUNTER — Other Ambulatory Visit: Payer: Self-pay | Admitting: Specialist

## 2015-06-06 DIAGNOSIS — M48061 Spinal stenosis, lumbar region without neurogenic claudication: Secondary | ICD-10-CM

## 2015-07-24 ENCOUNTER — Other Ambulatory Visit: Payer: Self-pay | Admitting: Internal Medicine

## 2015-08-14 ENCOUNTER — Ambulatory Visit
Admission: RE | Admit: 2015-08-14 | Discharge: 2015-08-14 | Disposition: A | Discharge status: Home / Self Care | Payer: 59 | Source: Ambulatory Visit | Attending: Specialist | Admitting: Specialist

## 2015-08-14 DIAGNOSIS — M48061 Spinal stenosis, lumbar region without neurogenic claudication: Secondary | ICD-10-CM

## 2015-08-14 MED ORDER — IOHEXOL 180 MG/ML  SOLN
15.0000 mL | Freq: Once | INTRAMUSCULAR | Status: AC | PRN
Start: 2015-08-14 — End: 2015-08-14
  Administered 2015-08-14: 15 mL via INTRATHECAL

## 2015-08-14 NOTE — Progress Notes (Signed)
Pt states he has been off Tramadol for the past 2 days. Discharge instructions explained to pt. 

## 2015-08-14 NOTE — Discharge Instructions (Signed)
Myelogram Discharge Instructions  1. Go home and rest quietly for the next 24 hours.  It is important to lie flat for the next 24 hours.  Get up only to go to the restroom.  You may lie in the bed or on a couch on your back, your stomach, your left side or your right side.  You may have one pillow under your head.  You may have pillows between your knees while you are on your side or under your knees while you are on your back.  2. DO NOT drive today.  Recline the seat as far back as it will go, while still wearing your seat belt, on the way home.  3. You may get up to go to the bathroom as needed.  You may sit up for 10 minutes to eat.  You may resume your normal diet and medications unless otherwise indicated.  Drink lots of extra fluids today and tomorrow.  4. The incidence of headache, nausea, or vomiting is about 5% (one in 20 patients).  If you develop a headache, lie flat and drink plenty of fluids until the headache goes away.  Caffeinated beverages may be helpful.  If you develop severe nausea and vomiting or a headache that does not go away with flat bed rest, call [phone].  5. You may resume normal activities after your 24 hours of bed rest is over; however, do not exert yourself strongly or do any heavy lifting tomorrow. If when you get up you have a headache when standing, go back to bed and force fluids for another 24 hours.  6. Call your physician for a follow-up appointment.  The results of your myelogram will be sent directly to your physician by the following day.  7. If you have any questions or if complications develop after you arrive home, please call [phone].  Discharge instructions have been explained to the patient.  The patient, or the person responsible for the patient, fully understands these instructions.       May resume Tramadol on Dec. 7, 2016, after 1:00 pm.

## 2015-08-27 ENCOUNTER — Other Ambulatory Visit: Payer: Self-pay | Admitting: Internal Medicine

## 2015-08-31 ENCOUNTER — Ambulatory Visit: Payer: Self-pay | Admitting: Orthopedic Surgery

## 2015-09-05 ENCOUNTER — Ambulatory Visit: Payer: Self-pay | Admitting: Orthopedic Surgery

## 2015-09-05 NOTE — H&P (Signed)
[name] is an 44 y.o. male.   Chief Complaint: back and leg pain HPI: The patient is a 44 year old male who presents today for follow up of their back. The patient is being followed for their back pain. They are now 7 1/2 months out from injury. Symptoms reported today include: pain. Current treatment includes: home exercise program, activity modification, NSAIDs (Aleve and Voltaren Gel) and TENS unit and use of heat. The following medication has been used for pain control: Ultram (prn). The patient reports their current pain level to be 5 / 10. The patient presents today following CT/Myelogram. Note for "Follow-up back": The patient is out of work.  He follows up with a CT myelogram. Interestingly, he has two types of pain; pain in the back and also in the buttock and down into the legs. He has had episodes where both his legs felt very weak. He now has some pain when he sits onto the left side down the left leg for long periods of time.  REVIEW OF SYSTEMS Review of systems is negative for fevers, chest pain, shortness of breath, unexplained recent weight loss, loss of bowel or bladder function, burning with urination, joint swelling, rashes, weakness or numbness, difficulty with balance, easy bruising, excessive thirst or frequent urination.   Past Medical History  Diagnosis Date  . IBS (irritable bowel syndrome)   . H. pylori infection   . Insomnia   . Depression   . Headache(784.0)   . Plantar fasciitis   . Allergic rhinitis   . GERD (gastroesophageal reflux disease)   . Hypertension   . Anxiety   . Anal fissure   . Gastric polyp 03/2005    Benign  . Hyperlipidemia 06/04/2012  . Pre-diabetes     Past Surgical History  Procedure Laterality Date  . Anal fissure repair  2003  . Rotator cuff repair    . Vasectomy      Family History  Problem Relation Age of Onset  . Hypertension Mother   . Diabetes Mother    Social History:  reports that he has never smoked. He has never  used smokeless tobacco. He reports that he drinks about 0.6 oz of alcohol per week. He reports that he does not use illicit drugs.  Allergies:  Allergies  Allergen Reactions  . Hydrocodone     Headache, hypersensitivity     (Not in a hospital admission)  No results found for this or any previous visit (from the past 48 hour(s)). No results found.  Review of Systems  Constitutional: Negative.   HENT: Negative.   Eyes: Negative.   Respiratory: Negative.   Cardiovascular: Negative.   Gastrointestinal: Negative.   Genitourinary: Negative.   Musculoskeletal: Positive for back pain.  Skin: Negative.   Neurological: Positive for sensory change and focal weakness.  Psychiatric/Behavioral: Negative.     There were no vitals taken for this visit. Physical Exam  Constitutional: He is oriented to person, place, and time. He appears well-developed.  HENT:  Head: Normocephalic.  Eyes: Pupils are equal, round, and reactive to light.  Neck: Normal range of motion.  Cardiovascular: Normal rate.   Respiratory: Effort normal.  GI: Soft.  Musculoskeletal:  On exam, straight leg raise, buttock and thigh pain on the left, negative on the right. There is trace quadriceps weakness on the left compared to the right, trace EHL weakness on the left. Lumbar spine exam reveals no evidence of soft tissue swelling, deformity or skin ecchymosis. On  palpation there is no tenderness of the lumbar spine. No flank pain with percussion. The abdomen is soft and nontender. Nontender over the trochanters. No cellulitis or lymphadenopathy.  Good range of motion of the lumbar spine without associated pain. Motor is 5/5 including tibialis anterior, plantar flexion, quadriceps and hamstrings. Patient is normoreflexic. There is no Babinski or clonus. Sensory exam is intact to light touch. Patient has good distal pulses. No DVT. No pain and normal range of motion without instability of the hips, knees and ankles.   Neurological: He is oriented to person, place, and time.  Skin: Skin is warm.     Assessment/Plan 1. 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 Lateral recess stenosis at 4-5 2. Elevated BMI. 3. 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   Plan is to review a CT myelogram. He does have when standing stenosis at 3-4 and 4-5. The 4-5 becomes worse, near complete block at 3-4 with the patient standing. Increased right greater than left L4-L5 nerve root impingement, side-to-side bending, increased stenosis in the lateral recess at 3-4 and at 4-5. Retrolisthesis of 3-4 and 4-5 with 1 millimeter of excursion. 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  We have extensive discussion concerning his current pathology, relevant anatomy and treatment options. Over 25 minutes dedicated at this discussion. We discussed living with his symptoms. He has had epidural where he had a significant temporary relief from epidurals, activity modification, strategy to avoid re-injury. We also discuss surgical option; decompression at 3-4 and 4-5, at least centrally due to bilateral symptoms. We did, however, indicate he had probably ongoing back pain secondary to disk degeneration. He may have a component of back pain from his near complete block at 3-4. In terms of the recovery, we discussed that in detail, and also work status. I do not feel he will return to heavy lifting and repetitive bending due to the disk degeneration. He is not interested in a multilevel fusion. We discussed microlumbar decompression and reducing his symptomatology. However, he will have residual backs pain in regardless. I think he understands that. He would like to contemplate his option. If he calls, we will set him up for a lumbar decompression at 3-4 and at 4-5. Continue with his current analgesics and restrictions.  I  had an extensive discussion of the risks and benefits of the lumbar decompression with the patient including bleeding, infection, damage to neurovascular structures, epidural fibrosis, CSF leak requiring repair. We also discussed increase in pain, adjacent segment disease, recurrent disc herniation, need for future surgery including repeat decompression and/or fusion. We also discussed risks of postoperative hematoma, paralysis, anesthetic complications including DVT, PE, death, cardiopulmonary dysfunction. In addition, the perioperative and postoperative courses were discussed in detail including the rehabilitative time and return to functional activity and work. I provided the patient with an illustrated handout and utilized the appropriate surgical models.  Plan microlumbar decompression L3-4, L4-5  Regena Delucchi M. PA-C for Dr. Tonita Cong 09/05/2015, 2:21 PM

## 2015-09-13 NOTE — Patient Instructions (Addendum)
LENIX SLEETER  09/13/2015   Your procedure is scheduled on: 09-20-15  Report to [facility] Main  Entrance take [facility]  elevators to 3rd floor to  [place] at  700 AM.  Call this number if you have problems the morning of surgery [phone]   Remember: ONLY 1 PERSON MAY GO WITH YOU TO SHORT STAY TO GET  READY MORNING OF [place].  Do not eat food or drink liquids :After Midnight.     Take these medicines the morning of surgery with A SIP OF WATER: LANSOPRAZOLE (PREVACID) DO NOT TAKE ANY DIABETIC MEDICATIONS DAY OF YOUR SURGERY                               You may not have any metal on your body including hair pins and              piercings  Do not wear jewelry, lotions, powders or perfumes, deodorant                    Men may shave face and neck.   Do not bring valuables to the hospital. [place].  Contacts, dentures or bridgework may not be worn into surgery.  Leave suitcase in the car. After surgery it may be brought to your room.      :  Special Instructions: coughing and deep breathing exercises, leg exercises              Please read over the following fact sheets you were given: _____________________________________________________________________             [facility] - Preparing for Surgery Before surgery, you can play an important role.  Because skin is not sterile, your skin needs to be as free of germs as possible.  You can reduce the number of germs on your skin by washing with CHG (chlorahexidine gluconate) soap before surgery.  CHG is an antiseptic cleaner which kills germs and bonds with the skin to continue killing germs even after washing. Please DO NOT use if you have an allergy to CHG or antibacterial soaps.  If your skin becomes reddened/irritated stop using the CHG and inform your nurse when you arrive at Short Stay. Do not shave (including legs and underarms) for at  least 48 hours prior to the first CHG shower.  You may shave your face/neck. Please follow these instructions carefully:  1.  Shower with CHG Soap the night before surgery and the  morning of Surgery.  2.  If you choose to wash your hair, wash your hair first as usual with your  normal  shampoo.  3.  After you shampoo, rinse your hair and body thoroughly to remove the  shampoo.                           4.  Use CHG as you would any other liquid soap.  You can apply chg directly  to the skin and wash                       Gently with a scrungie or clean washcloth.  5.  Apply the CHG  Soap to your body ONLY FROM THE NECK DOWN.   Do not use on face/ open                           Wound or open sores. Avoid contact with eyes, ears mouth and genitals (private parts).                       Wash face,  Genitals (private parts) with your normal soap.             6.  Wash thoroughly, paying special attention to the area where your surgery  will be performed.  7.  Thoroughly rinse your body with warm water from the neck down.  8.  DO NOT shower/wash with your normal soap after using and rinsing off  the CHG Soap.                9.  Pat yourself dry with a clean towel.            10.  Wear clean pajamas.            11.  Place clean sheets on your bed the night of your first shower and do not  sleep with pets. Day of Surgery : Do not apply any lotions/deodorants the morning of surgery.  Please wear clean clothes to the hospital/surgery center.  FAILURE TO FOLLOW THESE INSTRUCTIONS MAY RESULT IN THE CANCELLATION OF YOUR SURGERY PATIENT SIGNATURE_________________________________  NURSE SIGNATURE__________________________________  ________________________________________________________________________   Adam Phenix  An incentive spirometer is a tool that can help keep your lungs clear and active. This tool measures how well you are filling your lungs with each breath. Taking long deep breaths  may help reverse or decrease the chance of developing breathing (pulmonary) problems (especially infection) following:  A long period of time when you are unable to move or be active. BEFORE THE PROCEDURE   If the spirometer includes an indicator to show your best effort, your nurse or respiratory therapist will set it to a desired goal.  If possible, sit up straight or lean slightly forward. Try not to slouch.  Hold the incentive spirometer in an upright position. INSTRUCTIONS FOR USE  1. Sit on the edge of your bed if possible, or sit up as far as you can in bed or on a chair. 2. Hold the incentive spirometer in an upright position. 3. Breathe out normally. 4. Place the mouthpiece in your mouth and seal your lips tightly around it. 5. Breathe in slowly and as deeply as possible, raising the piston or the ball toward the top of the column. 6. Hold your breath for 3-5 seconds or for as long as possible. Allow the piston or ball to fall to the bottom of the column. 7. Remove the mouthpiece from your mouth and breathe out normally. 8. Rest for a few seconds and repeat Steps 1 through 7 at least 10 times every 1-2 hours when you are awake. Take your time and take a few normal breaths between deep breaths. 9. The spirometer may include an indicator to show your best effort. Use the indicator as a goal to work toward during each repetition. 10. After each set of 10 deep breaths, practice coughing to be sure your lungs are clear. If you have an incision (the cut made at the time of surgery), support your incision when coughing by placing a pillow or rolled up towels  firmly against it. Once you are able to get out of bed, walk around indoors and cough well. You may stop using the incentive spirometer when instructed by your caregiver.  RISKS AND COMPLICATIONS  Take your time so you do not get dizzy or light-headed.  If you are in pain, you may need to take or ask for pain medication before doing  incentive spirometry. It is harder to take a deep breath if you are having pain. AFTER USE  Rest and breathe slowly and easily.  It can be helpful to keep track of a log of your progress. Your caregiver can provide you with a simple table to help with this. If you are using the spirometer at home, follow these instructions: [place] IF:   You are having difficultly using the spirometer.  You have trouble using the spirometer as often as instructed.  Your pain medication is not giving enough relief while using the spirometer.  You develop fever of 100.5 F (38.1 C) or higher. SEEK IMMEDIATE MEDICAL CARE IF:   You cough up bloody sputum that had not been present before.  You develop fever of 102 F (38.9 C) or greater.  You develop worsening pain at or near the incision site. MAKE SURE YOU:   Understand these instructions.  Will watch your condition.  Will get help right away if you are not doing well or get worse. Document Released: 01/05/2007 Document Revised: 11/17/2011 Document Reviewed: 03/08/2007 [facility] Patient Information 2014 [place], [place].   ________________________________________________________________________

## 2015-09-14 ENCOUNTER — Encounter (HOSPITAL_COMMUNITY): Payer: Self-pay

## 2015-09-14 ENCOUNTER — Ambulatory Visit (HOSPITAL_COMMUNITY)
Admission: RE | Admit: 2015-09-14 | Discharge: 2015-09-14 | Disposition: A | Discharge status: Home / Self Care | Payer: 59 | Source: Ambulatory Visit | Attending: Orthopedic Surgery | Admitting: Orthopedic Surgery

## 2015-09-14 ENCOUNTER — Encounter (HOSPITAL_COMMUNITY)
Admission: RE | Admit: 2015-09-14 | Discharge: 2015-09-14 | Disposition: A | Discharge status: Home / Self Care | Payer: 59 | Source: Ambulatory Visit | Attending: Specialist | Admitting: Specialist

## 2015-09-14 ENCOUNTER — Encounter (INDEPENDENT_AMBULATORY_CARE_PROVIDER_SITE_OTHER): Payer: Self-pay

## 2015-09-14 DIAGNOSIS — R9431 Abnormal electrocardiogram [ECG] [EKG]: Secondary | ICD-10-CM | POA: Insufficient documentation

## 2015-09-14 DIAGNOSIS — G3189 Other specified degenerative diseases of nervous system: Secondary | ICD-10-CM | POA: Diagnosis not present

## 2015-09-14 DIAGNOSIS — M4800 Spinal stenosis, site unspecified: Secondary | ICD-10-CM

## 2015-09-14 HISTORY — DX: Other specified postprocedural states: Z98.890

## 2015-09-14 HISTORY — DX: Nausea with vomiting, unspecified: R11.2

## 2015-09-14 HISTORY — DX: Family history of other specified conditions: Z84.89

## 2015-09-14 LAB — BASIC METABOLIC PANEL
Anion gap: 11 (ref 5–15)
BUN: 18 mg/dL (ref 6–20)
CO2: 24 mmol/L (ref 22–32)
Calcium: 9.5 mg/dL (ref 8.9–10.3)
Chloride: 101 mmol/L (ref 101–111)
Creatinine, Ser: 0.88 mg/dL (ref 0.61–1.24)
GFR calc Af Amer: >60 mL/min (ref >60)
GFR calc non Af Amer: >60 mL/min (ref >60)
Glucose, Bld: 107 mg/dL — ABNORMAL HIGH (ref 65–99)
Potassium: 3.7 mmol/L (ref 3.5–5.1)
Sodium: 136 mmol/L (ref 135–145)

## 2015-09-14 LAB — CBC
HCT: 44.6 % (ref 39.0–52.0)
Hemoglobin: 15.7 g/dL (ref 13.0–17.0)
MCH: 30.1 pg (ref 26.0–34.0)
MCHC: 35.2 g/dL (ref 30.0–36.0)
MCV: 85.4 fL (ref 78.0–100.0)
Platelets: 226 10*3/uL (ref 150–400)
RBC: 5.22 MIL/uL (ref 4.22–5.81)
RDW: 13.2 % (ref 11.5–15.5)
WBC: 7.3 10*3/uL (ref 4.0–10.5)

## 2015-09-14 LAB — SURGICAL PCR SCREEN
MRSA, PCR: NEGATIVE
Staphylococcus aureus: POSITIVE — AB

## 2015-09-14 NOTE — Progress Notes (Signed)
Medical clearance note from Dr. Chapman Fitch - in chart

## 2015-09-14 NOTE — Progress Notes (Signed)
09-14-15 - Back X-ray results from preop appt. On 09-14-15 faxed to Dr. Tonita Cong via [facility]

## 2015-09-17 ENCOUNTER — Ambulatory Visit: Payer: Self-pay | Admitting: Orthopedic Surgery

## 2015-09-19 MED ORDER — DEXTROSE 5 % IV SOLN
3.0000 g | INTRAVENOUS | Status: AC
  Administered 2015-09-20: 3 g via INTRAVENOUS
  Filled 2015-09-19: qty 3000

## 2015-09-20 ENCOUNTER — Ambulatory Visit (HOSPITAL_COMMUNITY)
Admission: RE | Admit: 2015-09-20 | Discharge: 2015-09-21 | Disposition: A | Discharge status: Home / Self Care | Payer: 59 | Source: Ambulatory Visit | Attending: Specialist | Admitting: Specialist

## 2015-09-20 ENCOUNTER — Ambulatory Visit (HOSPITAL_COMMUNITY): Payer: 59 | Admitting: Certified Registered"

## 2015-09-20 ENCOUNTER — Encounter (HOSPITAL_COMMUNITY)
Admission: RE | Disposition: A | Discharge status: Home / Self Care | Payer: Self-pay | Source: Ambulatory Visit | Attending: Specialist

## 2015-09-20 ENCOUNTER — Encounter (HOSPITAL_COMMUNITY): Payer: Self-pay

## 2015-09-20 ENCOUNTER — Ambulatory Visit (HOSPITAL_COMMUNITY): Payer: 59

## 2015-09-20 DIAGNOSIS — Z6836 Body mass index (BMI) 36.0-36.9, adult: Secondary | ICD-10-CM | POA: Diagnosis not present

## 2015-09-20 DIAGNOSIS — K219 Gastro-esophageal reflux disease without esophagitis: Secondary | ICD-10-CM | POA: Diagnosis not present

## 2015-09-20 DIAGNOSIS — I10 Essential (primary) hypertension: Secondary | ICD-10-CM | POA: Insufficient documentation

## 2015-09-20 DIAGNOSIS — Z9889 Other specified postprocedural states: Secondary | ICD-10-CM | POA: Insufficient documentation

## 2015-09-20 DIAGNOSIS — E669 Obesity, unspecified: Secondary | ICD-10-CM | POA: Insufficient documentation

## 2015-09-20 DIAGNOSIS — M4806 Spinal stenosis, lumbar region: Secondary | ICD-10-CM | POA: Insufficient documentation

## 2015-09-20 DIAGNOSIS — E785 Hyperlipidemia, unspecified: Secondary | ICD-10-CM | POA: Diagnosis not present

## 2015-09-20 DIAGNOSIS — Z419 Encounter for procedure for purposes other than remedying health state, unspecified: Secondary | ICD-10-CM

## 2015-09-20 DIAGNOSIS — M48061 Spinal stenosis, lumbar region without neurogenic claudication: Secondary | ICD-10-CM | POA: Diagnosis present

## 2015-09-20 HISTORY — PX: LUMBAR LAMINECTOMY/DECOMPRESSION MICRODISCECTOMY: SHX5026

## 2015-09-20 SURGERY — LUMBAR LAMINECTOMY/DECOMPRESSION MICRODISCECTOMY 2 LEVELS
Anesthesia: General | Site: Back

## 2015-09-20 MED ORDER — MENTHOL 3 MG MT LOZG
1.0000 | LOZENGE | OROMUCOSAL | Status: DC | PRN
  Administered 2015-09-20: 3 mg via ORAL
  Filled 2015-09-20: qty 9

## 2015-09-20 MED ORDER — ROCURONIUM BROMIDE 100 MG/10ML IV SOLN
INTRAVENOUS | Status: AC
  Filled 2015-09-20: qty 1

## 2015-09-20 MED ORDER — SENNOSIDES-DOCUSATE SODIUM 8.6-50 MG PO TABS: 1.0000 | ORAL_TABLET | Freq: Every evening | ORAL | Status: DC | PRN

## 2015-09-20 MED ORDER — SUCCINYLCHOLINE CHLORIDE 20 MG/ML IJ SOLN
INTRAMUSCULAR | Status: DC | PRN
  Administered 2015-09-20: 100 mg via INTRAVENOUS

## 2015-09-20 MED ORDER — DOCUSATE SODIUM 100 MG PO CAPS
100.0000 mg | ORAL_CAPSULE | Freq: Two times a day (BID) | ORAL | Status: DC
Start: 2015-09-20 — End: 2015-09-21
  Administered 2015-09-20 – 2015-09-21 (×2): 100 mg via ORAL

## 2015-09-20 MED ORDER — ONDANSETRON HCL 4 MG/2ML IJ SOLN
INTRAMUSCULAR | Status: DC | PRN
  Administered 2015-09-20 (×2): 4 mg via INTRAVENOUS

## 2015-09-20 MED ORDER — CLINDAMYCIN PHOSPHATE 900 MG/50ML IV SOLN
INTRAVENOUS | Status: AC
  Filled 2015-09-20: qty 50

## 2015-09-20 MED ORDER — FENTANYL CITRATE (PF) 100 MCG/2ML IJ SOLN
INTRAMUSCULAR | Status: DC | PRN
  Administered 2015-09-20: 100 ug via INTRAVENOUS
  Administered 2015-09-20 (×2): 25 ug via INTRAVENOUS
  Administered 2015-09-20 (×2): 50 ug via INTRAVENOUS

## 2015-09-20 MED ORDER — ONDANSETRON HCL 4 MG/2ML IJ SOLN
INTRAMUSCULAR | Status: AC
  Filled 2015-09-20: qty 4

## 2015-09-20 MED ORDER — PROPOFOL 10 MG/ML IV BOLUS
INTRAVENOUS | Status: AC
  Filled 2015-09-20: qty 40

## 2015-09-20 MED ORDER — THROMBIN 5000 UNITS EX SOLR
CUTANEOUS | Status: AC
  Filled 2015-09-20: qty 10000

## 2015-09-20 MED ORDER — MIDAZOLAM HCL 2 MG/2ML IJ SOLN
INTRAMUSCULAR | Status: AC
  Filled 2015-09-20: qty 2

## 2015-09-20 MED ORDER — OXYCODONE HCL 5 MG/5ML PO SOLN: 5.0000 mg | Freq: Once | ORAL | Status: DC | PRN

## 2015-09-20 MED ORDER — OXYCODONE HCL 5 MG PO TABS: 5.0000 mg | ORAL_TABLET | Freq: Once | ORAL | Status: DC | PRN

## 2015-09-20 MED ORDER — ONDANSETRON HCL 4 MG/2ML IJ SOLN: 4.0000 mg | INTRAMUSCULAR | Status: DC | PRN

## 2015-09-20 MED ORDER — HYDROMORPHONE HCL 1 MG/ML IJ SOLN: 0.5000 mg | INTRAMUSCULAR | Status: DC | PRN

## 2015-09-20 MED ORDER — ALUM & MAG HYDROXIDE-SIMETH 200-200-20 MG/5ML PO SUSP
30.0000 mL | Freq: Four times a day (QID) | ORAL | Status: DC | PRN
Start: 2015-09-20 — End: 2015-09-21

## 2015-09-20 MED ORDER — SODIUM CHLORIDE 0.9 % IR SOLN
Status: DC | PRN
  Administered 2015-09-20: 500 mL

## 2015-09-20 MED ORDER — ALPRAZOLAM 1 MG PO TABS
1.0000 mg | ORAL_TABLET | Freq: Every evening | ORAL | Status: DC | PRN
  Administered 2015-09-21: 1 mg via ORAL
  Filled 2015-09-20: qty 1

## 2015-09-20 MED ORDER — ROCURONIUM BROMIDE 100 MG/10ML IV SOLN
INTRAVENOUS | Status: DC | PRN
  Administered 2015-09-20 (×2): 10 mg via INTRAVENOUS
  Administered 2015-09-20: 20 mg via INTRAVENOUS
  Administered 2015-09-20: 30 mg via INTRAVENOUS

## 2015-09-20 MED ORDER — DEXAMETHASONE SODIUM PHOSPHATE 10 MG/ML IJ SOLN
INTRAMUSCULAR | Status: AC
  Filled 2015-09-20: qty 1

## 2015-09-20 MED ORDER — DEXAMETHASONE SODIUM PHOSPHATE 10 MG/ML IJ SOLN
INTRAMUSCULAR | Status: DC | PRN
  Administered 2015-09-20: 5 mg via INTRAVENOUS

## 2015-09-20 MED ORDER — SUGAMMADEX SODIUM 200 MG/2ML IV SOLN
INTRAVENOUS | Status: DC | PRN
  Administered 2015-09-20: 200 mg via INTRAVENOUS

## 2015-09-20 MED ORDER — LIDOCAINE HCL (CARDIAC) 20 MG/ML IV SOLN
INTRAVENOUS | Status: DC | PRN
  Administered 2015-09-20: 100 mg via INTRAVENOUS

## 2015-09-20 MED ORDER — OXYCODONE-ACETAMINOPHEN 7.5-325 MG PO TABS: 1.0000 | ORAL_TABLET | ORAL | Status: DC | PRN

## 2015-09-20 MED ORDER — THROMBIN 5000 UNITS EX SOLR
CUTANEOUS | Status: DC | PRN
  Administered 2015-09-20: 5000 [IU] via TOPICAL

## 2015-09-20 MED ORDER — METHOCARBAMOL 500 MG PO TABS
500.0000 mg | ORAL_TABLET | Freq: Four times a day (QID) | ORAL | Status: DC | PRN
  Administered 2015-09-21: 500 mg via ORAL
  Filled 2015-09-20 (×2): qty 1

## 2015-09-20 MED ORDER — METHOCARBAMOL 500 MG PO TABS: 500.0000 mg | ORAL_TABLET | Freq: Three times a day (TID) | ORAL | Status: DC | PRN

## 2015-09-20 MED ORDER — MAGNESIUM CITRATE PO SOLN: 1.0000 | Freq: Once | ORAL | Status: DC | PRN

## 2015-09-20 MED ORDER — CLINDAMYCIN PHOSPHATE 900 MG/50ML IV SOLN
900.0000 mg | INTRAVENOUS | Status: AC
  Administered 2015-09-20: 900 mg via INTRAVENOUS

## 2015-09-20 MED ORDER — LACTATED RINGERS IV SOLN
INTRAVENOUS | Status: DC
  Administered 2015-09-20: 13:00:00 via INTRAVENOUS

## 2015-09-20 MED ORDER — DOCUSATE SODIUM 100 MG PO CAPS: 100.0000 mg | ORAL_CAPSULE | Freq: Two times a day (BID) | ORAL | Status: DC | PRN

## 2015-09-20 MED ORDER — FENTANYL CITRATE (PF) 250 MCG/5ML IJ SOLN
INTRAMUSCULAR | Status: AC
  Filled 2015-09-20: qty 5

## 2015-09-20 MED ORDER — RISAQUAD PO CAPS
1.0000 | ORAL_CAPSULE | Freq: Every day | ORAL | Status: DC
  Administered 2015-09-21: 1 via ORAL
  Filled 2015-09-20 (×2): qty 1

## 2015-09-20 MED ORDER — SCOPOLAMINE 1 MG/3DAYS TD PT72
MEDICATED_PATCH | TRANSDERMAL | Status: DC | PRN
  Administered 2015-09-20: 1 via TRANSDERMAL

## 2015-09-20 MED ORDER — PROPOFOL 10 MG/ML IV BOLUS
INTRAVENOUS | Status: DC | PRN
  Administered 2015-09-20: 50 mg via INTRAVENOUS
  Administered 2015-09-20: 250 mg via INTRAVENOUS

## 2015-09-20 MED ORDER — SUGAMMADEX SODIUM 200 MG/2ML IV SOLN
INTRAVENOUS | Status: AC
  Filled 2015-09-20: qty 2

## 2015-09-20 MED ORDER — PROMETHAZINE HCL 25 MG/ML IJ SOLN
12.5000 mg | Freq: Four times a day (QID) | INTRAMUSCULAR | Status: DC | PRN
  Administered 2015-09-20: 12.5 mg via INTRAVENOUS
  Filled 2015-09-20: qty 1

## 2015-09-20 MED ORDER — SCOPOLAMINE 1 MG/3DAYS TD PT72
MEDICATED_PATCH | TRANSDERMAL | Status: AC
  Filled 2015-09-20: qty 1

## 2015-09-20 MED ORDER — BUPIVACAINE-EPINEPHRINE (PF) 0.5% -1:200000 IJ SOLN
INTRAMUSCULAR | Status: DC | PRN
Start: 2015-09-20 — End: 2015-09-20
  Administered 2015-09-20: 16 mL

## 2015-09-20 MED ORDER — FOLIC ACID 1 MG PO TABS
1.0000 mg | ORAL_TABLET | Freq: Every day | ORAL | Status: DC
  Administered 2015-09-21: 1 mg via ORAL
  Filled 2015-09-20 (×2): qty 1

## 2015-09-20 MED ORDER — KCL IN DEXTROSE-NACL 20-5-0.45 MEQ/L-%-% IV SOLN
INTRAVENOUS | Status: AC
  Administered 2015-09-20: 50 mL/h via INTRAVENOUS
  Filled 2015-09-20 (×2): qty 1000

## 2015-09-20 MED ORDER — PANTOPRAZOLE SODIUM 20 MG PO TBEC
20.0000 mg | DELAYED_RELEASE_TABLET | Freq: Every day | ORAL | Status: DC
  Administered 2015-09-21: 20 mg via ORAL
  Filled 2015-09-20: qty 1

## 2015-09-20 MED ORDER — LACTATED RINGERS IV SOLN
INTRAVENOUS | Status: AC
  Administered 2015-09-20: 10:00:00 via INTRAVENOUS
  Administered 2015-09-20: 1000 mL via INTRAVENOUS

## 2015-09-20 MED ORDER — PHENOL 1.4 % MT LIQD: 1.0000 | OROMUCOSAL | Status: DC | PRN

## 2015-09-20 MED ORDER — OXYCODONE-ACETAMINOPHEN 5-325 MG PO TABS
1.0000 | ORAL_TABLET | ORAL | Status: DC | PRN
  Administered 2015-09-20: 1 via ORAL
  Administered 2015-09-20 – 2015-09-21 (×3): 2 via ORAL
  Filled 2015-09-20 (×4): qty 2

## 2015-09-20 MED ORDER — BUPIVACAINE-EPINEPHRINE (PF) 0.5% -1:200000 IJ SOLN
INTRAMUSCULAR | Status: AC
  Filled 2015-09-20: qty 30

## 2015-09-20 MED ORDER — ACETAMINOPHEN 325 MG PO TABS: 650.0000 mg | ORAL_TABLET | ORAL | Status: DC | PRN

## 2015-09-20 MED ORDER — HYDROMORPHONE HCL 1 MG/ML IJ SOLN
INTRAMUSCULAR | Status: AC
  Filled 2015-09-20: qty 1

## 2015-09-20 MED ORDER — BISACODYL 5 MG PO TBEC: 5.0000 mg | DELAYED_RELEASE_TABLET | Freq: Every day | ORAL | Status: DC | PRN

## 2015-09-20 MED ORDER — DEXTROSE 5 % IV SOLN
3.0000 g | Freq: Three times a day (TID) | INTRAVENOUS | Status: AC
  Administered 2015-09-20 – 2015-09-21 (×3): 3 g via INTRAVENOUS
  Filled 2015-09-20 (×5): qty 3000

## 2015-09-20 MED ORDER — ACETAMINOPHEN 650 MG RE SUPP
650.0000 mg | RECTAL | Status: DC | PRN
Start: 2015-09-20 — End: 2015-09-21

## 2015-09-20 MED ORDER — ONDANSETRON HCL 4 MG/2ML IJ SOLN: 4.0000 mg | Freq: Four times a day (QID) | INTRAMUSCULAR | Status: DC | PRN

## 2015-09-20 MED ORDER — MIDAZOLAM HCL 5 MG/5ML IJ SOLN
INTRAMUSCULAR | Status: DC | PRN
  Administered 2015-09-20: 2 mg via INTRAVENOUS

## 2015-09-20 MED ORDER — SODIUM CHLORIDE 0.9 % IR SOLN
Status: AC
  Filled 2015-09-20: qty 1

## 2015-09-20 MED ORDER — HYDROMORPHONE HCL 1 MG/ML IJ SOLN
0.2500 mg | INTRAMUSCULAR | Status: DC | PRN
  Administered 2015-09-20: 0.5 mg via INTRAVENOUS

## 2015-09-20 MED ORDER — METHOCARBAMOL 1000 MG/10ML IJ SOLN
500.0000 mg | Freq: Four times a day (QID) | INTRAVENOUS | Status: DC | PRN
  Administered 2015-09-20: 500 mg via INTRAVENOUS
  Filled 2015-09-20 (×2): qty 5

## 2015-09-20 SURGICAL SUPPLY — 51 items
BAG SPEC THK2 15X12 ZIP CLS (MISCELLANEOUS)
BAG ZIPLOCK 12X15 (MISCELLANEOUS) IMPLANT
CLEANER TIP ELECTROSURG 2X2 (MISCELLANEOUS) ×2 IMPLANT
CLOTH 2% CHLOROHEXIDINE 3PK (PERSONAL CARE ITEMS) ×2 IMPLANT
DRAPE MICROSCOPE LEICA (MISCELLANEOUS) ×2 IMPLANT
DRAPE SHEET LG 3/4 BI-LAMINATE (DRAPES) IMPLANT
DRAPE SURG 17X11 SM STRL (DRAPES) ×2 IMPLANT
DRAPE UTILITY XL STRL (DRAPES) ×2 IMPLANT
DRSG AQUACEL AG ADV 3.5X 4 (GAUZE/BANDAGES/DRESSINGS) IMPLANT
DRSG AQUACEL AG ADV 3.5X 6 (GAUZE/BANDAGES/DRESSINGS) ×1 IMPLANT
DURAPREP 26ML APPLICATOR (WOUND CARE) ×3 IMPLANT
DURASEAL SPINE SEALANT 3ML (MISCELLANEOUS) IMPLANT
ELECT BLADE TIP CTD 4 INCH (ELECTRODE) IMPLANT
ELECT REM PT RETURN 9FT ADLT (ELECTROSURGICAL) ×2
ELECTRODE REM PT RTRN 9FT ADLT (ELECTROSURGICAL) ×1 IMPLANT
GLOVE BIOGEL PI IND STRL 7.0 (GLOVE) ×1 IMPLANT
GLOVE BIOGEL PI INDICATOR 7.0 (GLOVE) ×1
GLOVE SURG SS PI 7.0 STRL IVOR (GLOVE) ×2 IMPLANT
GLOVE SURG SS PI 7.5 STRL IVOR (GLOVE) ×2 IMPLANT
GLOVE SURG SS PI 8.0 STRL IVOR (GLOVE) ×4 IMPLANT
GOWN STRL REUS W/TWL XL LVL3 (GOWN DISPOSABLE) ×4 IMPLANT
IV CATH 14GX2 1/4 (CATHETERS) IMPLANT
KIT BASIN OR (CUSTOM PROCEDURE TRAY) ×2 IMPLANT
KIT POSITIONING SURG ANDREWS (MISCELLANEOUS) ×2 IMPLANT
MANIFOLD NEPTUNE II (INSTRUMENTS) ×2 IMPLANT
MARKER SKIN DUAL TIP RULER LAB (MISCELLANEOUS) ×2 IMPLANT
NDL SPNL 18GX3.5 QUINCKE PK (NEEDLE) ×2 IMPLANT
NEEDLE SPNL 18GX3.5 QUINCKE PK (NEEDLE) ×4 IMPLANT
PACK LAMINECTOMY ORTHO (CUSTOM PROCEDURE TRAY) ×2 IMPLANT
PATTIES SURGICAL .5 X.5 (GAUZE/BANDAGES/DRESSINGS) ×2 IMPLANT
PATTIES SURGICAL .75X.75 (GAUZE/BANDAGES/DRESSINGS) ×2 IMPLANT
PATTIES SURGICAL 1X1 (DISPOSABLE) IMPLANT
RUBBERBAND STERILE (MISCELLANEOUS) ×2 IMPLANT
SPONGE LAP 4X18 X RAY DECT (DISPOSABLE) ×1 IMPLANT
SPONGE SURGIFOAM ABS GEL 100 (HEMOSTASIS) ×2 IMPLANT
STAPLER VISISTAT (STAPLE) ×1 IMPLANT
STRIP CLOSURE SKIN 1/2X4 (GAUZE/BANDAGES/DRESSINGS) IMPLANT
SUT NURALON 4 0 TR CR/8 (SUTURE) IMPLANT
SUT PROLENE 3 0 PS 2 (SUTURE) IMPLANT
SUT STRATAFIX 0 PDS 27 VIOLET (SUTURE) ×2
SUT VIC AB 1 CT1 27 (SUTURE)
SUT VIC AB 1 CT1 27XBRD ANTBC (SUTURE) IMPLANT
SUT VIC AB 1-0 CT2 27 (SUTURE) IMPLANT
SUT VIC AB 2-0 CT1 27 (SUTURE)
SUT VIC AB 2-0 CT1 TAPERPNT 27 (SUTURE) IMPLANT
SUT VIC AB 2-0 CT2 27 (SUTURE) IMPLANT
SUTURE STRATFX 0 PDS 27 VIOLET (SUTURE) IMPLANT
SYR 3ML LL SCALE MARK (SYRINGE) IMPLANT
TOWEL OR 17X26 10 PK STRL BLUE (TOWEL DISPOSABLE) ×2 IMPLANT
TOWEL OR NON WOVEN STRL DISP B (DISPOSABLE) ×2 IMPLANT
YANKAUER SUCT BULB TIP NO VENT (SUCTIONS) ×2 IMPLANT

## 2015-09-20 NOTE — Anesthesia Procedure Notes (Signed)
Procedure Name: Intubation Date/Time: 09/20/2015 10:14 AM Performed by: Freddie Breech Pre-anesthesia Checklist: Patient identified, Emergency Drugs available, Suction available, Timeout performed and Patient being monitored Patient Re-evaluated:Patient Re-evaluated prior to inductionOxygen Delivery Method: Circle system utilized Preoxygenation: Pre-oxygenation with 100% oxygen Intubation Type: IV induction Ventilation: Mask ventilation without difficulty Laryngoscope Size: Mac and 4 Grade View: Grade II Tube type: Oral Tube size: 7.5 mm Number of attempts: 1 Airway Equipment and Method: Patient positioned with wedge pillow and Stylet Placement Confirmation: ETT inserted through vocal cords under direct vision,  positive ETCO2,  breath sounds checked- equal and bilateral and CO2 detector Secured at: 23 cm Tube secured with: Tape Dental Injury: Teeth and Oropharynx as per pre-operative assessment

## 2015-09-20 NOTE — Anesthesia Postprocedure Evaluation (Signed)
Anesthesia Post Note  Patient: [name]  Procedure(s) Performed: Procedure(s) (LRB): LUMBAR LAMINECTOMY/DECOMPRESSION MICRODISCECTOMY 2 LEVELS L3-4 L4-5 (N/A)  Patient location during evaluation: PACU Anesthesia Type: General Level of consciousness: awake and alert and patient cooperative Pain management: pain level controlled Vital Signs Assessment: post-procedure vital signs reviewed and stable Respiratory status: spontaneous breathing and respiratory function stable Cardiovascular status: stable Anesthetic complications: no    Last Vitals:  Filed Vitals:   09/20/15 1238 09/20/15 1245  BP:  129/75  Pulse: 82 80  Temp:    Resp: 14 15    Last Pain:  Filed Vitals:   09/20/15 1247  PainSc: [place]

## 2015-09-20 NOTE — H&P (View-Only) (Signed)
[name] is an 45 y.o. male.   Chief Complaint: back and leg pain HPI: The patient is a 45 year old male who presents today for follow up of their back. The patient is being followed for their back pain. They are now 7 1/2 months out from injury. Symptoms reported today include: pain. Current treatment includes: home exercise program, activity modification, NSAIDs (Aleve and Voltaren Gel) and TENS unit and use of heat. The following medication has been used for pain control: Ultram (prn). The patient reports their current pain level to be 5 / 10. The patient presents today following CT/Myelogram. Note for "Follow-up back": The patient is out of work.  He follows up with a CT myelogram. Interestingly, he has two types of pain; pain in the back and also in the buttock and down into the legs. He has had episodes where both his legs felt very weak. He now has some pain when he sits onto the left side down the left leg for long periods of time.  REVIEW OF SYSTEMS Review of systems is negative for fevers, chest pain, shortness of breath, unexplained recent weight loss, loss of bowel or bladder function, burning with urination, joint swelling, rashes, weakness or numbness, difficulty with balance, easy bruising, excessive thirst or frequent urination.   Past Medical History  Diagnosis Date  . IBS (irritable bowel syndrome)   . H. pylori infection   . Insomnia   . Depression   . Headache(784.0)   . Plantar fasciitis   . Allergic rhinitis   . GERD (gastroesophageal reflux disease)   . Hypertension   . Anxiety   . Anal fissure   . Gastric polyp 03/2005    Benign  . Hyperlipidemia 06/04/2012  . Pre-diabetes     Past Surgical History  Procedure Laterality Date  . Anal fissure repair  2003  . Rotator cuff repair    . Vasectomy      Family History  Problem Relation Age of Onset  . Hypertension Mother   . Diabetes Mother    Social History:  reports that he has never smoked. He has never  used smokeless tobacco. He reports that he drinks about 0.6 oz of alcohol per week. He reports that he does not use illicit drugs.  Allergies:  Allergies  Allergen Reactions  . Hydrocodone     Headache, hypersensitivity     (Not in a hospital admission)  No results found for this or any previous visit (from the past 48 hour(s)). No results found.  Review of Systems  Constitutional: Negative.   HENT: Negative.   Eyes: Negative.   Respiratory: Negative.   Cardiovascular: Negative.   Gastrointestinal: Negative.   Genitourinary: Negative.   Musculoskeletal: Positive for back pain.  Skin: Negative.   Neurological: Positive for sensory change and focal weakness.  Psychiatric/Behavioral: Negative.     There were no vitals taken for this visit. Physical Exam  Constitutional: He is oriented to person, place, and time. He appears well-developed.  HENT:  Head: Normocephalic.  Eyes: Pupils are equal, round, and reactive to light.  Neck: Normal range of motion.  Cardiovascular: Normal rate.   Respiratory: Effort normal.  GI: Soft.  Musculoskeletal:  On exam, straight leg raise, buttock and thigh pain on the left, negative on the right. There is trace quadriceps weakness on the left compared to the right, trace EHL weakness on the left. Lumbar spine exam reveals no evidence of soft tissue swelling, deformity or skin ecchymosis. On  palpation there is no tenderness of the lumbar spine. No flank pain with percussion. The abdomen is soft and nontender. Nontender over the trochanters. No cellulitis or lymphadenopathy.  Good range of motion of the lumbar spine without associated pain. Motor is 5/5 including tibialis anterior, plantar flexion, quadriceps and hamstrings. Patient is normoreflexic. There is no Babinski or clonus. Sensory exam is intact to light touch. Patient has good distal pulses. No DVT. No pain and normal range of motion without instability of the hips, knees and ankles.   Neurological: He is oriented to person, place, and time.  Skin: Skin is warm.     Assessment/Plan 1. 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 Lateral recess stenosis at 4-5 2. Elevated BMI. 3. 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   Plan is to review a CT myelogram. He does have when standing stenosis at 3-4 and 4-5. The 4-5 becomes worse, near complete block at 3-4 with the patient standing. Increased right greater than left L4-L5 nerve root impingement, side-to-side bending, increased stenosis in the lateral recess at 3-4 and at 4-5. Retrolisthesis of 3-4 and 4-5 with 1 millimeter of excursion. 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  We have extensive discussion concerning his current pathology, relevant anatomy and treatment options. Over 25 minutes dedicated at this discussion. We discussed living with his symptoms. He has had epidural where he had a significant temporary relief from epidurals, activity modification, strategy to avoid re-injury. We also discuss surgical option; decompression at 3-4 and 4-5, at least centrally due to bilateral symptoms. We did, however, indicate he had probably ongoing back pain secondary to disk degeneration. He may have a component of back pain from his near complete block at 3-4. In terms of the recovery, we discussed that in detail, and also work status. I do not feel he will return to heavy lifting and repetitive bending due to the disk degeneration. He is not interested in a multilevel fusion. We discussed microlumbar decompression and reducing his symptomatology. However, he will have residual backs pain in regardless. I think he understands that. He would like to contemplate his option. If he calls, we will set him up for a lumbar decompression at 3-4 and at 4-5. Continue with his current analgesics and restrictions.  I  had an extensive discussion of the risks and benefits of the lumbar decompression with the patient including bleeding, infection, damage to neurovascular structures, epidural fibrosis, CSF leak requiring repair. We also discussed increase in pain, adjacent segment disease, recurrent disc herniation, need for future surgery including repeat decompression and/or fusion. We also discussed risks of postoperative hematoma, paralysis, anesthetic complications including DVT, PE, death, cardiopulmonary dysfunction. In addition, the perioperative and postoperative courses were discussed in detail including the rehabilitative time and return to functional activity and work. I provided the patient with an illustrated handout and utilized the appropriate surgical models.  Plan microlumbar decompression L3-4, L4-5  BISSELL, JACLYN M. PA-C for Dr. Tonita Cong 09/05/2015, 2:21 PM

## 2015-09-20 NOTE — Discharge Instructions (Signed)
Walk As Tolerated utilizing back precautions.  No bending, twisting, or lifting.  No driving for 2 weeks.   °Aquacel dressing may remain in place until follow up. May shower with aquacel dressing in place. If the dressing peels off or becomes saturated, you may remove aquacel dressing and place gauze and tape dressing which should be kept clean and dry and changed daily. Do not remove steri-strips if they are present. °See Dr. Tyreque Finken in office in 10 to 14 days. Begin taking aspirin 81mg per day starting 4 days after your surgery if not allergic to aspirin or on another blood thinner. °Walk daily even outside. Use a cane or walker only if necessary. °Avoid sitting on soft sofas. ° °

## 2015-09-20 NOTE — Interval H&P Note (Signed)
History and Physical Interval Note:  09/20/2015 7:32 AM  [name]  has presented today for surgery, with the diagnosis of SPINAL STENOSIS L3-4 L4-5  The various methods of treatment have been discussed with the patient and family. After consideration of risks, benefits and other options for treatment, the patient has consented to  Procedure(s): LUMBAR LAMINECTOMY/DECOMPRESSION MICRODISCECTOMY 2 LEVELS L3-4 L4-5 (N/A) as a surgical intervention .  The patient's history has been reviewed, patient examined, no change in status, stable for surgery.  I have reviewed the patient's chart and labs.  Questions were answered to the patient's satisfaction.     [name] C

## 2015-09-20 NOTE — Op Note (Signed)
[name], [name] NO.:  [id number]  MEDICAL RECORD NO.:  [id number]  LOCATION:  [facility]                         FACILITY:  [facility]  PHYSICIAN:  Susa Day, M.D.    DATE OF BIRTH:  1971/05/29  DATE OF PROCEDURE:  09/20/2015 DATE OF DISCHARGE:                              OPERATIVE REPORT   PREOPERATIVE DIAGNOSIS:  Congenital and acquired spinal stenosis at L3-4 and L4-5.  POSTOPERATIVE DIAGNOSIS:  Congenital and acquired spinal stenosis at L3- 4 and L4-5.  PROCEDURE PERFORMED: 1. Microlumbar decompression L4-5, L3-4. 2. Foraminotomies at L3, L4, and L5 bilaterally.  ASSISTANT:  Nehemiah Massed, PA.  Technical difficulty increased due to the patient's elevated BMI, which was 36.  BRIEF HISTORY AND INDICATION:  This is a 45 year old male with neurogenic claudication secondary to spinal stenosis, congenital and acquired a progressive disk degeneration, and back and lower extremity pain, worse with standing, better with sitting.  CT myelogram indicating near complete block at 3, 4 and extension and significant lateral recess stenosis in the AP at L4-5.  This is refractory neurogenic claudication secondary to spinal stenosis, temporary relief from epidural steroid injection, failing conservative treatment.  He was indicated for decompression at 3-4 and at 4-5.  This required decompression from the pedicle of 3 to below 5.  Risk and benefits were discussed including bleeding, infection, damage to neurovascular structures, DVT, PE, anesthetic complications, no change in symptoms, worsening symptoms, need for repeat decompression or fusion in the future, etc.  TECHNIQUE:  The patient in the supine position, after induction of adequate anesthesia, 3 g Kefzol and 900 clindamycin, he was placed prone on the [place] frame.  All bony prominences well padded.  Lumbar region was prepped and draped in usual sterile fashion.  Foley to gravity, two 18-gauge spinal needle  was utilized to localize 3-4 and 4-5 interspace confirmed with x-ray.  Incision was made from spinous process 3 to below L5.  Subcutaneous tissue was dissected.  Electrocautery was utilized to achieve hemostasis.  He had a fairly significant subcutaneous adipose layer.  Dorsolumbar fascia identified divided in line with skin incision.  Paraspinous muscle elevated from lamina 3-4 and 4-5.  Deep extra long McCullough retractors were placed, two of them bilaterally. We obtained confirmatory radiographs with Kocher's on the spinous process of 3 and 4, 5.  The operating microscope was draped and brought on the surgical field.  We removed the spinous process of 4, partial of 5 and partial of 3.  First, attention was turned towards the level at 4 and 5.  Detached ligamentum of flavum from the cephalad edge of 5 with a straight curette.  Then detached ligamentum flavum from the caudad edge of 4, with a straight curette and 2 mm Kerrison for detaching ligamentum flavum.  Neuro patty placed beneath the ligamentum flavum.  Ligamentum flavum removed from the interspace.  He had a severe hypertrophic ligamentum flavum, particularly in the lateral recess with severe compression in the lateral recess and affecting of the L5 nerve roots. We performed foraminotomies of 5 bilaterally.  I gently mobilized and protected the nerve roots.  Then decompressed the lateral recesses to the medial border of  the pedicle bilaterally performing foraminotomies of L4 and L5.  We examined the disk.  There was some degenerative, mild bulging, but no herniation.  Bipolar electrocautery was utilized to achieve strict hemostasis as was thrombin-soaked Gelfoam and bone wax on the cancellous surfaces.  Good restoration of thecal sac was noted.  No CSF leakage or active bleeding.  Neural probe was passed freely up the foramen of 4 and 5.  Following the decompression at 4 and 5.  We then covered the laminar defect.  We then  addressed the stenosis at 3 and 4 in a similar fashion.  We used the curette to detach the ligamentum of flavum from the cephalad edge of 4 and the caudad edge of 3.  We performed hemilaminotomies bilaterally at L3, cephalad to detach the ligamentum flavum and then caudad detached to the ligamentum flavum as well.  Neuro patties were placed beneath the ligamentum flavum.  The central stenosis was noted here, fairly severe.  We decompressed both sides, removing the hypertrophic ligamentum.  There was also an extent of venous epidural lipomatosis noted at this level as well and a portion of this was removed.  Following decompression of lateral recess, the medial border of the pedicle bilaterally foraminotomies of 3 and 4 were performed bilaterally.  Bipolar electrocautery was utilized to achieve hemostasis as was bone wax, cancellous, and thrombin-soaked Gelfoam.  We obtained a confirmatory radiograph with a neuro probe out the foramen of 3 and just below L5 indicating the area preoperative stenosis again centrally at 3-4 in the lateral recesses at 4-5.  Wound was copiously irrigated.  Again, no evidence of CSF leakage or active bleeding. Residual epidural fat was draped over the laminotomy defect.  We placed some thrombin-soaked Gelfoam there as well.  Copiously irrigated the wound.  Electrocautery was utilized to achieve strict hemostasis.  We removed the [facility] retractors, suspect the paraspinous musculature. Electrocautery was utilized to achieve hemostasis.  We repaired the dorsolumbar fascia with #1 Vicryl interrupted figure-of-eight sutures in a running V-Loc.  Subcu with multiple 2-0.  Had ample subcutaneous adipose tissue.  The skin with staples.  Wound was dressed sterilely. He was then placed supine on the hospital bed, extubated without difficulty, and transported to recovery room in satisfactory condition.  The patient tolerated the procedure well with no complications.   Blood loss 100 mL.  Urine output was 250 mL.     Susa Day, M.D.     Geralynn Rile  D:  09/20/2015  T:  09/20/2015  Job:  [id number]

## 2015-09-20 NOTE — Transfer of Care (Signed)
Immediate Anesthesia Transfer of Care Note  Patient: [name]  Procedure(s) Performed: Procedure(s): LUMBAR LAMINECTOMY/DECOMPRESSION MICRODISCECTOMY 2 LEVELS L3-4 L4-5 (N/A)  Patient Location: PACU  Anesthesia Type:General  Level of Consciousness: patient cooperative, lethargic and responds to stimulation  Airway & Oxygen Therapy: Patient Spontanous Breathing and Patient connected to face mask  Post-op Assessment: Report given to RN and Post -op Vital signs reviewed and stable  Post vital signs: stable  Last Vitals:  Filed Vitals:   09/20/15 0710  BP: 117/82  Pulse: 81  Temp: 36.6 C  Resp: 18    Complications: No apparent anesthesia complications

## 2015-09-20 NOTE — Brief Op Note (Signed)
09/20/2015  11:44 AM  PATIENT:  [name]  45 y.o. male  PRE-OPERATIVE DIAGNOSIS:  SPINAL STENOSIS L3-4 L4-5  POST-OPERATIVE DIAGNOSIS:  SPINAL STENOSIS L3-4 L4-5  PROCEDURE:  Procedure(s): LUMBAR LAMINECTOMY/DECOMPRESSION MICRODISCECTOMY 2 LEVELS L3-4 L4-5 (N/A)  SURGEON:  Surgeon(s) and Role:    * Susa Day, MD - Primary  PHYSICIAN ASSISTANT:   ASSISTANTSMancel Bale   ANESTHESIA:   general  EBL:  Total I/O In: 1000 [I.V.:1000] Out: 300 [Urine:200; Blood:100]  BLOOD ADMINISTERED:none  DRAINS: none   LOCAL MEDICATIONS USED:  MARCAINE     SPECIMEN:  No Specimen  DISPOSITION OF SPECIMEN:  N/A  COUNTS:  YES  TOURNIQUET:  * No tourniquets in log *  DICTATION: .Other Dictation: Dictation Number  [id number]  PLAN OF CARE: Admit for overnight observation  PATIENT DISPOSITION:  PACU - hemodynamically stable.   Delay start of Pharmacological VTE agent (>24hrs) due to surgical blood loss or risk of bleeding: yes

## 2015-09-20 NOTE — Anesthesia Preprocedure Evaluation (Signed)
Anesthesia Evaluation  Patient identified by MRN, date of birth, ID band Patient awake    Reviewed: Allergy & Precautions, NPO status , Patient's Chart, lab work & pertinent test results  History of Anesthesia Complications (+) PONV and Family history of anesthesia reaction  Airway Mallampati: II   Neck ROM: full    Dental   Pulmonary neg pulmonary ROS,    breath sounds clear to auscultation       Cardiovascular hypertension,  Rhythm:regular Rate:Normal     Neuro/Psych  Headaches, Anxiety Depression    GI/Hepatic GERD  ,  Endo/Other  obese  Renal/GU      Musculoskeletal   Abdominal   Peds  Hematology   Anesthesia Other Findings   Reproductive/Obstetrics                             Anesthesia Physical Anesthesia Plan  ASA: II  Anesthesia Plan: General   Post-op Pain Management:    Induction: Intravenous  Airway Management Planned: Oral ETT  Additional Equipment:   Intra-op Plan:   Post-operative Plan: Extubation in OR  Informed Consent: I have reviewed the patients History and Physical, chart, labs and discussed the procedure including the risks, benefits and alternatives for the proposed anesthesia with the patient or authorized representative who has indicated his/her understanding and acceptance.     Plan Discussed with: CRNA, Anesthesiologist and Surgeon  Anesthesia Plan Comments:         Anesthesia Quick Evaluation

## 2015-09-21 DIAGNOSIS — M4806 Spinal stenosis, lumbar region: Secondary | ICD-10-CM | POA: Diagnosis not present

## 2015-09-21 LAB — BASIC METABOLIC PANEL
Anion gap: 11 (ref 5–15)
BUN: 14 mg/dL (ref 6–20)
CO2: 26 mmol/L (ref 22–32)
Calcium: 9.1 mg/dL (ref 8.9–10.3)
Chloride: 102 mmol/L (ref 101–111)
Creatinine, Ser: 0.95 mg/dL (ref 0.61–1.24)
GFR calc Af Amer: >60 mL/min (ref >60)
GFR calc non Af Amer: >60 mL/min (ref >60)
Glucose, Bld: 152 mg/dL — ABNORMAL HIGH (ref 65–99)
Potassium: 3.8 mmol/L (ref 3.5–5.1)
Sodium: 139 mmol/L (ref 135–145)

## 2015-09-21 LAB — CBC
HCT: 41 % (ref 39.0–52.0)
Hemoglobin: 14.1 g/dL (ref 13.0–17.0)
MCH: 29.9 pg (ref 26.0–34.0)
MCHC: 34.4 g/dL (ref 30.0–36.0)
MCV: 86.9 fL (ref 78.0–100.0)
Platelets: 237 10*3/uL (ref 150–400)
RBC: 4.72 MIL/uL (ref 4.22–5.81)
RDW: 13.3 % (ref 11.5–15.5)
WBC: 17.4 10*3/uL — ABNORMAL HIGH (ref 4.0–10.5)

## 2015-09-21 NOTE — Evaluation (Signed)
Physical Therapy Evaluation Patient Details Name: [name] MRN: [id number] DOB: Apr 24, 1971 Today's Date: 09/21/2015   History of Present Illness  s/p L3-4 and L4-5 decompression  Clinical Impression  Pt s/p back surgery and presenting with functional mobility limitations 2* post op pain and back precautions.  Pt educated on back precautions and basic mobility including stairs and currently mobilizing at Sup level with good awareness of limitations.    Follow Up Recommendations No PT follow up    Equipment Recommendations  None recommended by PT    Recommendations for Other Services OT consult     Precautions / Restrictions Precautions Precautions: Back Precaution Booklet Issued: Yes (comment) Restrictions Weight Bearing Restrictions: No      Mobility  Bed Mobility Overal bed mobility: Needs Assistance Bed Mobility: Supine to Sit     Supine to sit: Supervision   Sit to sidelying: Supervision General bed mobility comments: cues for back precautions/technique  Transfers Overall transfer level: Needs assistance Equipment used: None Transfers: Sit to/from Stand Sit to Stand: Supervision         General transfer comment: cues for back precautions and use  of UEs to self assist  Ambulation/Gait Ambulation/Gait assistance: Min guard;Supervision Ambulation Distance (Feet): 400 Feet Assistive device: Rolling walker (2 wheeled);None Gait Pattern/deviations: WFL(Within Functional Limits) Gait velocity: mod pace   General Gait Details: 200' with RW, 200' sans AD  Stairs Stairs: Yes Stairs assistance: Min guard Stair Management: One rail Left;No rails Number of Stairs: 8 General stair comments: 4 stairs with single rail, 4 steps sans rail  Wheelchair Mobility    Modified Rankin (Stroke Patients Only)       Balance                                             Pertinent Vitals/Pain Pain Assessment: 0-10 Pain Score: 4  Pain  Location: back Pain Descriptors / Indicators: Sore Pain Intervention(s): Limited activity within patient's tolerance;Monitored during session;Premedicated before session    Home Living Family/patient expects to be discharged to:: Private residence Living Arrangements: Spouse/significant other Available Help at Discharge: Family Type of Home: House Home Access: Stairs to enter Entrance Stairs-Rails: None Technical brewer of Steps: 4 Home Layout: Two level Home Equipment: None      Prior Function Level of Independence: Independent               Hand Dominance        Extremity/Trunk Assessment   Upper Extremity Assessment: Overall WFL for tasks assessed           Lower Extremity Assessment: Overall WFL for tasks assessed      Cervical / Trunk Assessment: Normal  Communication   Communication: No difficulties  Cognition Arousal/Alertness: Awake/alert Behavior During Therapy: WFL for tasks assessed/performed Overall Cognitive Status: Within Functional Limits for tasks assessed                      General Comments      Exercises        Assessment/Plan    PT Assessment Patent does not need any further PT services  PT Diagnosis Difficulty walking   PT Problem List    PT Treatment Interventions     PT Goals (Current goals can be found in the Care Plan section) Acute Rehab PT Goals Patient Stated Goal: return to being independent  Frequency     Barriers to discharge        Co-evaluation               End of Session   Activity Tolerance: Patient tolerated treatment well Patient left: in chair;with call bell/phone within reach Nurse Communication: Mobility status    Functional Assessment Tool Used: Clinical judgement Functional Limitation: Mobility: Walking and moving around Mobility: Walking and Moving Around Current Status [id number]): At least 1 percent but less than 20 percent impaired, limited or restricted Mobility:  Walking and Moving Around Goal Status [phone]): At least 1 percent but less than 20 percent impaired, limited or restricted Mobility: Walking and Moving Around Discharge Status [phone]): At least 1 percent but less than 20 percent impaired, limited or restricted    Time: 0812-0834 PT Time Calculation (min) (ACUTE ONLY): 22 min   Charges:   PT Evaluation $PT Eval Low Complexity: 1 Procedure     PT G Codes:   PT G-Codes **NOT FOR INPATIENT CLASS** Functional Assessment Tool Used: Clinical judgement Functional Limitation: Mobility: Walking and moving around Mobility: Walking and Moving Around Current Status [id number]): At least 1 percent but less than 20 percent impaired, limited or restricted Mobility: Walking and Moving Around Goal Status [phone]): At least 1 percent but less than 20 percent impaired, limited or restricted Mobility: Walking and Moving Around Discharge Status [phone]): At least 1 percent but less than 20 percent impaired, limited or restricted    [name] 09/21/2015, 10:50 AM

## 2015-09-21 NOTE — Care Management Note (Signed)
Case Management Note  Patient Details  Name: [name] MRN: [id number] Date of Birth: April 22, 1971  Subjective/Objective:   s/p L3-4 and L4-5 decompression                 Action/Plan: Discharge planning, no HH needs identified  Expected Discharge Date:                  Expected Discharge Plan:  Home/Self Care  In-House Referral:  NA  Discharge planning Services  CM Consult  Post Acute Care Choice:  NA Choice offered to:  NA  DME Arranged:  N/A DME Agency:  NA  HH Arranged:  NA HH Agency:  NA  Status of Service:  Completed, signed off  Medicare Important Message Given:    Date Medicare IM Given:    Medicare IM give by:    Date Additional Medicare IM Given:    Additional Medicare Important Message give by:     If discussed at [place] of Stay Meetings, dates discussed:    Additional Comments:  Guadalupe Maple, RN 09/21/2015, 12:39 PM

## 2015-09-21 NOTE — Evaluation (Signed)
Occupational Therapy Evaluation Patient Details Name: [name] MRN: [id number] DOB: 30-Jul-1971 Today's Date: 09/21/2015    History of Present Illness s/p L3-4 and L4-5 decompression   Clinical Impression   This 45 year old man was admitted for the above surgery.  All education was completed. No further OT is needed at this time    Follow Up Recommendations  No OT follow up    Equipment Recommendations  None recommended by OT    Recommendations for Other Services       Precautions / Restrictions Precautions Precautions: Back Restrictions Weight Bearing Restrictions: No      Mobility Bed Mobility Overal bed mobility: Needs Assistance           Sit to sidelying: Supervision General bed mobility comments: cues for back precautions/technique  Transfers Overall transfer level: Needs assistance   Transfers: Sit to/from Stand Sit to Stand: Supervision         General transfer comment: cues for back precautions    Balance                                            ADL Overall ADL's : Needs assistance/impaired     Grooming: Set up;Supervision/safety;Standing   Upper Body Bathing: Set up;Sitting   Lower Body Bathing: Moderate assistance;Sit to/from stand   Upper Body Dressing : Set up;Sitting   Lower Body Dressing: Sit to/from stand;Moderate assistance;With adaptive equipment (pants only)   Toilet Transfer: Supervision/safety;Ambulation;Comfort height toilet;RW   Toileting- Clothing Manipulation and Hygiene: Supervision/safety   Tub/ Shower Transfer: Walk-in shower;Supervision/safety     General ADL Comments: pt usually bends forward to don LB clothing.  Educated on alternative methods and educated on AE.  Pt will have wife assist him with socks and he has slip on shoes.  Educated on back precautions/ADLs. Pt verbalizes understanding of all education     Vision     Perception     Praxis      Pertinent Vitals/Pain Pain  Assessment: 0-10 Pain Score: 5  Pain Location: back Pain Intervention(s): Limited activity within patient's tolerance;Monitored during session;Premedicated before session;Repositioned     Hand Dominance     Extremity/Trunk Assessment Upper Extremity Assessment Upper Extremity Assessment: Overall WFL for tasks assessed           Communication Communication Communication: No difficulties   Cognition Arousal/Alertness: Awake/alert Behavior During Therapy: WFL for tasks assessed/performed Overall Cognitive Status: Within Functional Limits for tasks assessed                     General Comments       Exercises       Shoulder Instructions      Home Living Family/patient expects to be discharged to:: Private residence Living Arrangements: Spouse/significant other                 Bathroom Shower/Tub: Occupational psychologist: Handicapped height (with sink next to it)                Prior Functioning/Environment Level of Independence: Independent             OT Diagnosis: Acute pain   OT Problem List:     OT Treatment/Interventions:      OT Goals(Current goals can be found in the care plan section) Acute Rehab OT Goals Patient Stated Goal:  return to being independent OT Goal Formulation: All assessment and education complete, DC therapy  OT Frequency:     Barriers to D/C:            Co-evaluation              End of Session    Activity Tolerance: Patient tolerated treatment well;No increased pain Patient left: in bed;with call bell/phone within reach;with bed alarm set   Time: [id number] OT Time Calculation (min): 18 min Charges:  OT General Charges $OT Visit: 1 Procedure OT Evaluation $OT Eval Low Complexity: 1 Procedure G-Codes: OT G-codes **NOT FOR INPATIENT CLASS** Functional Assessment Tool Used: clinical judgment and observation Functional Limitation: Self care Self Care Current Status [id number]): At least 40  percent but less than 60 percent impaired, limited or restricted Self Care Goal Status [id number]): At least 40 percent but less than 60 percent impaired, limited or restricted Self Care Discharge Status [phone]): At least 40 percent but less than 60 percent impaired, limited or restricted  [facility] 09/21/2015, 9:14 AM  Lesle Chris, OTR/L [phone] 09/21/2015

## 2015-09-21 NOTE — Progress Notes (Signed)
Discharge instructions explained to pt. States he understands. Prescriptions given to pt. Dressing on Lumbar region changed. Saturated with old dark blood and sticking to bed. Steri strips intact. No drainage noted upon changing dressing, area around incision cleansed with normal saline. Pt will be discharged to wife.

## 2016-08-19 IMAGING — CT CT L SPINE W/ CM
3 of 8 series · 10 of 33 positions shown, 11 images · non-contrast
Comparison: Outside MRI not available.

CLINICAL DATA: Neurogenic claudication with suspected congenital
and acquired stenosis.
TECHNIQUE: Contiguous axial images were obtained through the Lumbar spine after
the intrathecal infusion of infusion. Coronal and sagittal
reconstructions were obtained of the axial image sets.

[Series 2: l spine soft · axial · 0.27mm/px · z∈[+521,+620]mm · 2 of 79 slices shown, 3 images]
[im 23/79  soft-tissue]
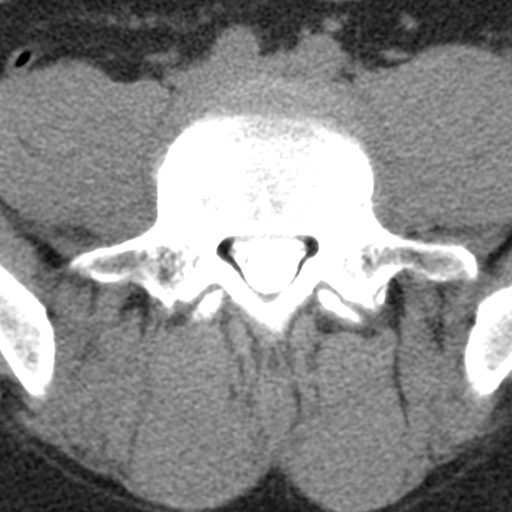
[im 23/79  bone]
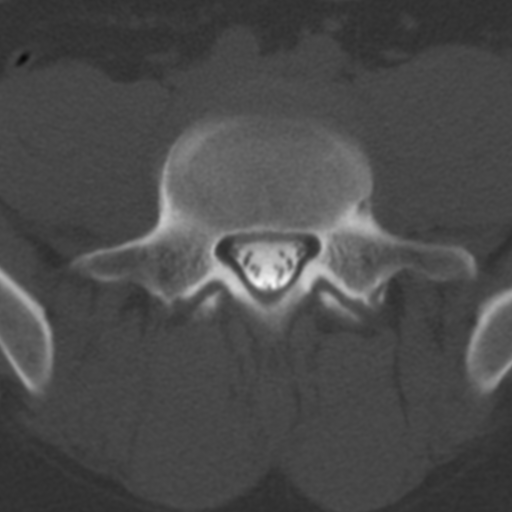
[im 56/79  bone]
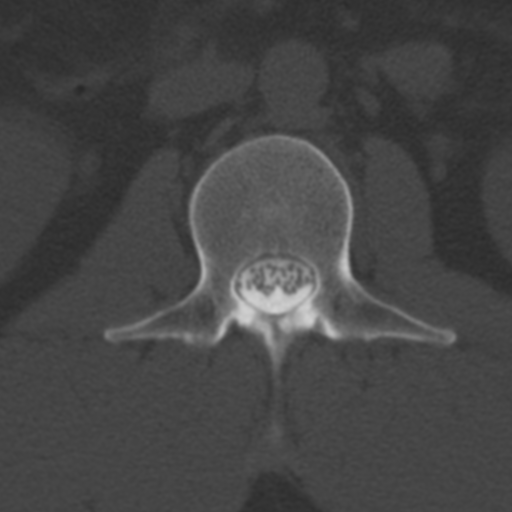

[Series 6: bone cor · coronal · 0.27mm/px · 3 of 41 slices shown]
[im 9/41  bone]
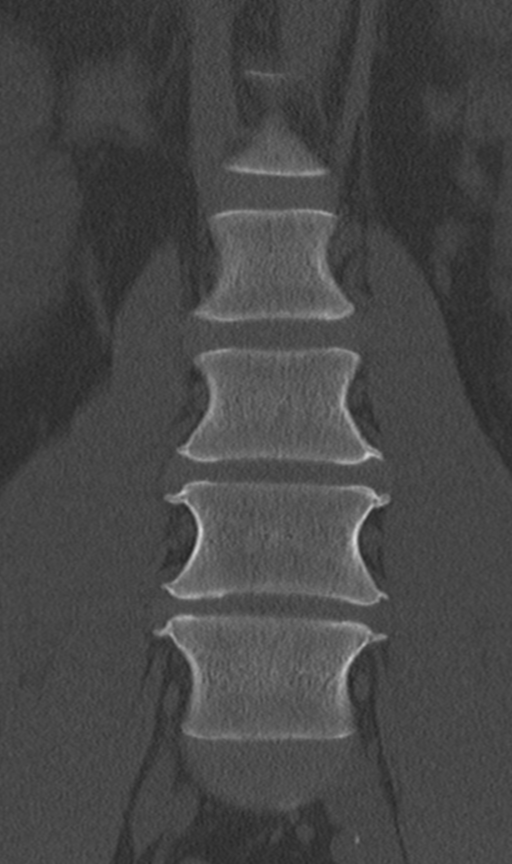
[im 17/41  bone]
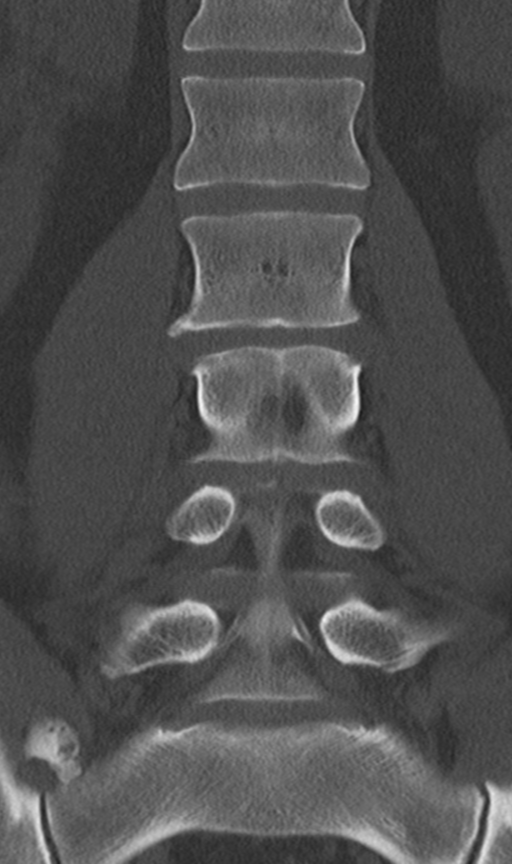
[im 25/41  bone]
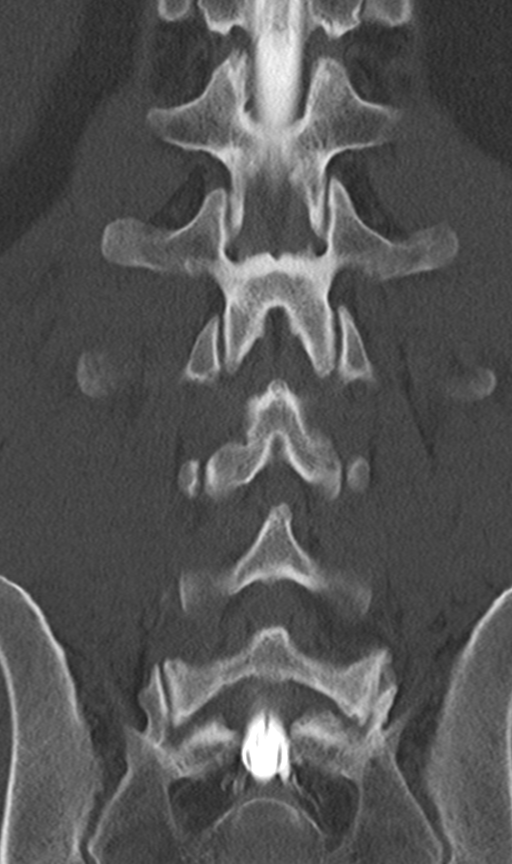

[Series 7: sag bone · sagittal · 0.27mm/px · 5 of 45 slices shown]
[im 15/45  bone]
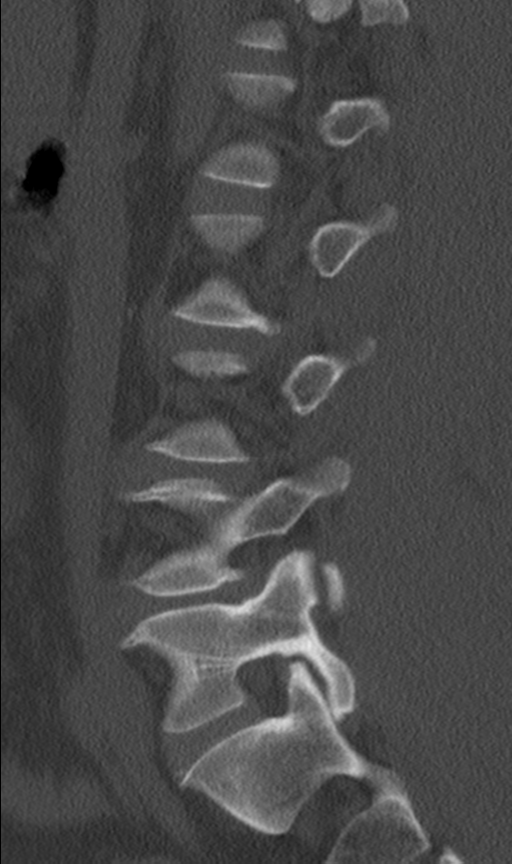
[im 19/45  bone]
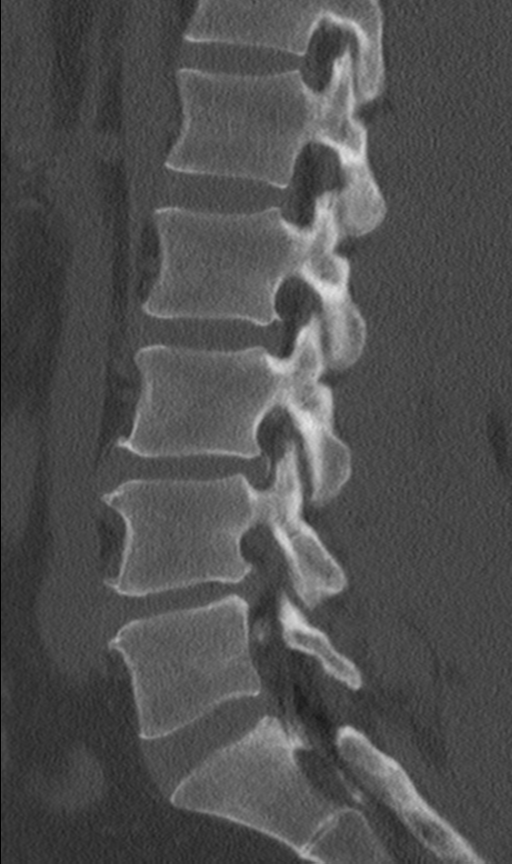
[im 23/45  bone]
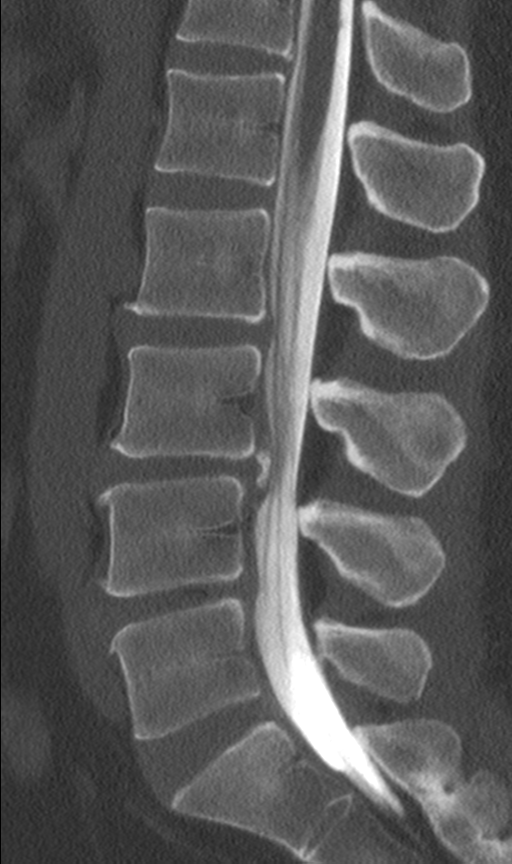
[im 26/45  bone]
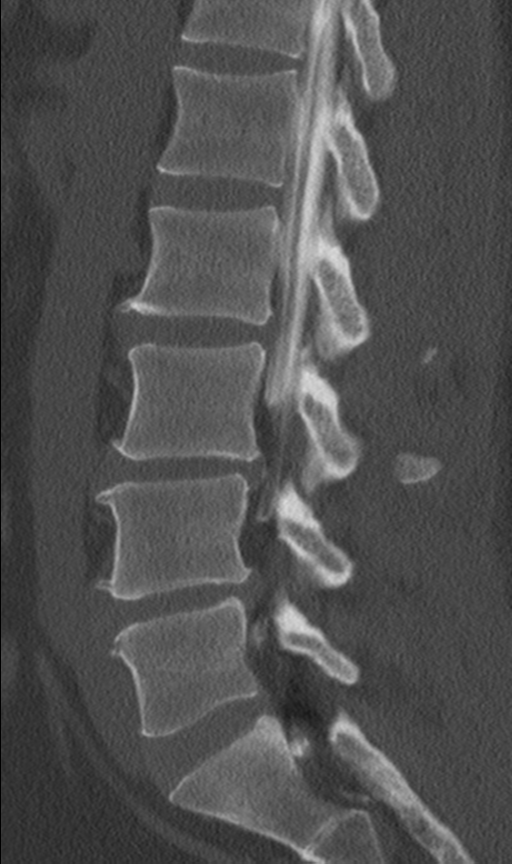
[im 30/45  bone]
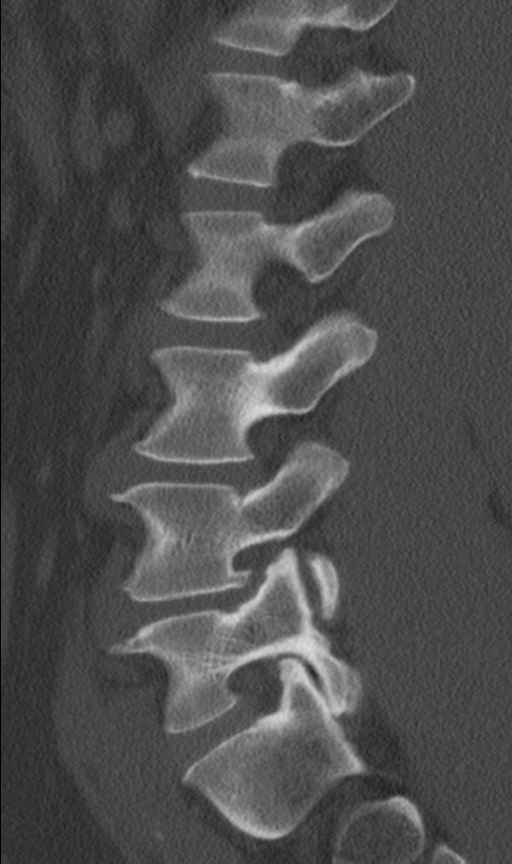

[10 of 33 positions shown; findings below may reference images not displayed]

EXAM:
LUMBAR MYELOGRAM

FLUOROSCOPY TIME:  1 minutes and 34 seconds.  Fifteen spot films.

PROCEDURE:
After thorough discussion of risks and benefits of the procedure
including bleeding, infection, injury to nerves, blood vessels,
adjacent structures as well as headache and CSF leak, written and
oral informed consent was obtained. Consent was obtained by Dr. Okieh
Dzu. Time out form was completed.

Patient was positioned prone on the fluoroscopy table. Local
anesthesia was provided with 1% lidocaine without epinephrine after
prepped and draped in the usual sterile fashion. Puncture was
performed at L5-S1 using a 5 inch 22-gauge spinal needle via midline
approach. Using a single pass through the dura, the needle was
placed within the thecal sac, with return of clear CSF. 15 mL of
1mnipaque-SFB was injected into the thecal sac, with normal
opacification of the nerve roots and cauda equina consistent with
free flow within the subarachnoid space.

I personally performed the lumbar puncture and administered the
intrathecal contrast. I also personally supervised acquisition of
the myelogram images.
FINDINGS: LUMBAR MYELOGRAM FINDINGS:

Good opacification lumbar subarachnoid space. Moderate disc space
narrowing at L4-5. Severe spinal stenosis at L3-4. Mild to moderate
spinal stenosis at L4-5. Anatomic alignment. Large ventral defect at
L3-4. Smaller ventral defects at L2-3 and L4-5. BILATERAL L4 nerve
root effacement is present. Only mild BILATERAL L5 nerve root
effacement is seen. There is trace retrolisthesis at L3-4

With patient standing, the stenosis at L3-4 and L4-5 become worse.
There is near complete block at L3-4 with patient standing.
Increased RIGHT greater than LEFT L4 and L5 nerve root impingement
are observed at the L3-4 and L4-5 levels respectively.

With side-to-side bending, there is increased stenosis and
subarticular zone narrowing at both L3-4 and L4-5 with bending to
the RIGHT and LEFT.

With patient standing in lateral lateral, there is increasing
prominence of the protrusion of L3-4. 2 mm of retrolisthesis
develops with patient standing in neutral at L3-4. This increases to
3 mm in extension. This reduces in flexion.

There is anatomic alignment at L4-5 with patient prone for
myelography, but slight retrolisthesis develops in neutral standing,
measuring 2 mm, increasing to 3 mm in extension.

In extension, there is also increased stenosis at L3-4 and L4-5 due
to ligamentum flavum infolding. Congenitally short pedicles are also
contributory.

CT LUMBAR MYELOGRAM FINDINGS:

Segmentation: Normal.

Alignment: With the patient supine for myelography, the alignment is
normal except for 2 mm retrolisthesis at L3-4.

Vertebrae: No worrisome osseous lesion.Congenitally short pedicles
contribute to stenosis

Conus medullaris: Normal in size and location.

Paraspinal tissues: No evidence for hydronephrosis or paravertebral
mass.

Disc levels:

L1-L2:  Normal.

L2-L3: Mild bulge. Marked extraforaminal spurring on the RIGHT. No
subarticular zone or foraminal zone narrowing, nor spinal stenosis,
but the RIGHT L2 nerve root may be irritated in the extraforaminal
compartment.

L3-L4: Congenital and acquired stenosis related to broad-based
calcified disc protrusion, osseous ridging, 2 mm retrolisthesis, and
posterior element hypertrophy. Moderate stenosis is present with
RIGHT greater than LEFT L4 nerve root encroachment in the canal.
Mild foraminal narrowing does not clearly affect the L3 nerve roots.

L4-L5: Normal alignment. Broad-based central protrusion with
components extending into the foraminal and extraforaminal
compartment also associated with osseous ridging. Mild stenosis with
congenitally short pedicles. Mild facet arthropathy. BILATERAL L4
nerve root impingement in the foramina, equally severe RIGHT versus
LEFT. Significant L5 nerve root impingement not appreciated.

L5-S1: Normal disc space. Mild facet arthropathy. No subarticular
zone or foraminal zone narrowing.
IMPRESSION: LUMBAR MYELOGRAM IMPRESSION:

Congenital and acquired stenosis, L3-4 greater than L4-5.

Mild dynamic instability at both L3-4 and L4-5, with slight
retrolisthesis developing or increasing with the patient standing.
Side to side bending also increases the degree of stenosis at L3-4
and L4-5.

Stenosis is most severe at L3-4 with patient standing in extension,
where ligamentum flavum infolding compounds short pedicles and
worsening disc pathology.

CT LUMBAR MYELOGRAM IMPRESSION:

Congenital and acquired moderate stenosis at L3-4 relates to
calcified central protrusion, posterior element hypertrophy, short
pedicles, and retrolisthesis RIGHT greater than LEFT L4 nerve root
encroachment canal. Mild foraminal narrowing does not clearly affect
the L3 nerve roots.

Congenital and acquired mild stenosis at L4-5 relates to broad-based
central protrusion, mild facet arthropathy, congenitally short
pedicles. No significant subarticular zone narrowing affecting the
L5 nerve roots, but BILATERAL foraminal narrowing appears compress
both L4 nerve roots.

Extraforaminal spurring at L2-3 on the RIGHT. Correlate clinically
for RIGHT L2 nerve root irritation.

## 2016-08-19 IMAGING — RF DG MYELOGRAPHY LUMBAR INJ LUMBOSACRAL
12 of 15 series · 12 of 15 positions shown · non-contrast
Comparison: Outside MRI not available.

CLINICAL DATA: Neurogenic claudication with suspected congenital
and acquired stenosis.
TECHNIQUE: Contiguous axial images were obtained through the Lumbar spine after
the intrathecal infusion of infusion. Coronal and sagittal
reconstructions were obtained of the axial image sets.

[Series 1: (hospital) · 1 of 1 slices shown]
[im 1/1]
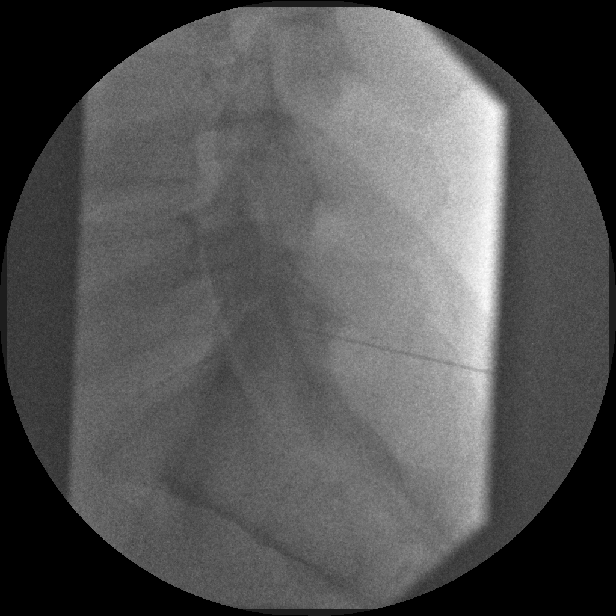

[Series 2: myelogram  white · 1 of 1 slices shown (1 of 11)]
[im 1/1]
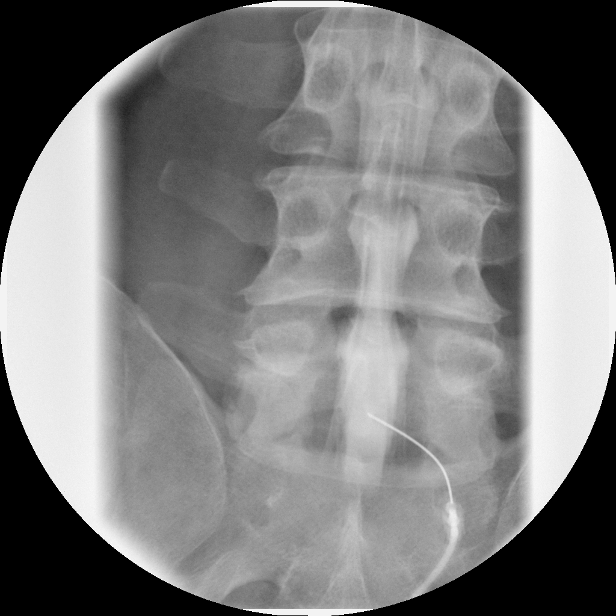

[Series 4: myelogram  white · 1 of 1 slices shown (2 of 11)]
[im 1/1]
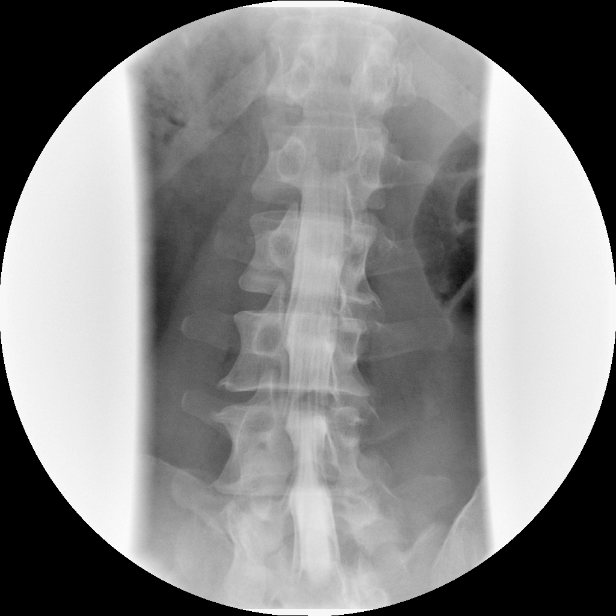

[Series 5: myelogram  white · 1 of 1 slices shown (3 of 11)]
[im 1/1]
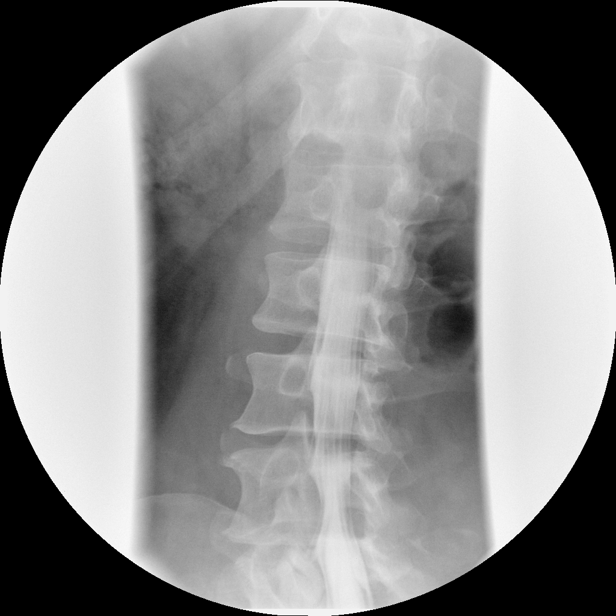

[Series 6: myelogram  white · 1 of 1 slices shown (4 of 11)]
[im 1/1]
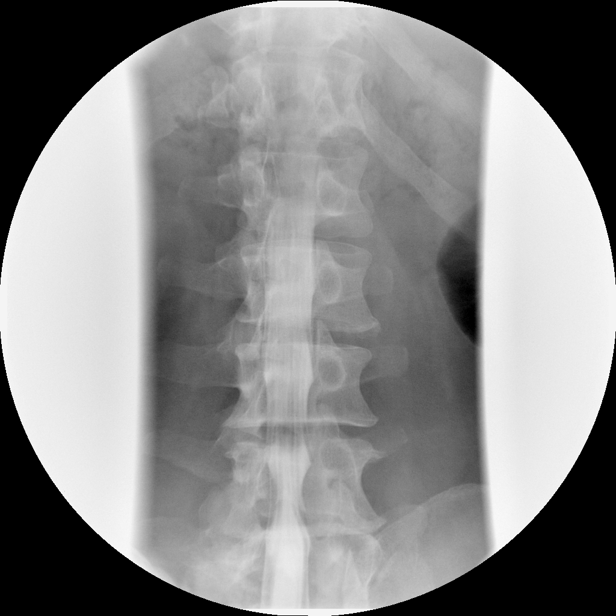

[Series 7: myelogram  white · 1 of 1 slices shown (5 of 11)]
[im 1/1]
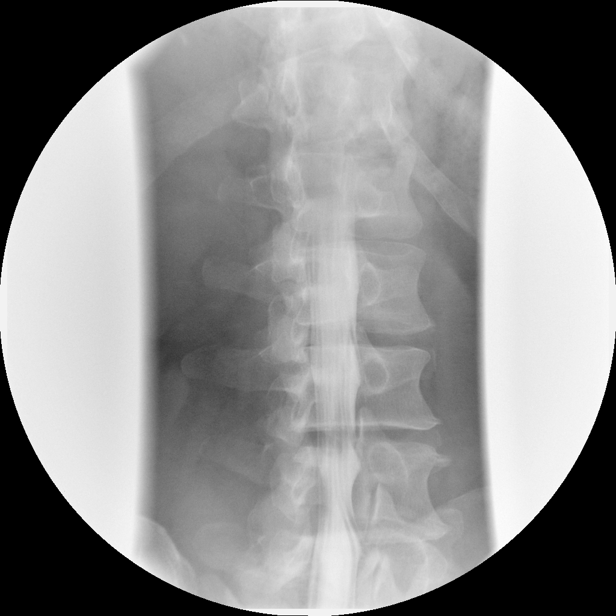

[Series 9: myelogram  white · 1 of 1 slices shown (6 of 11)]
[im 1/1]
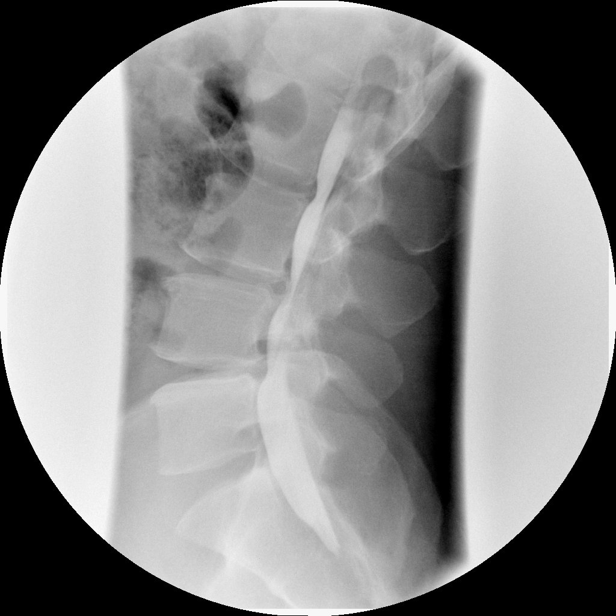

[Series 10: myelogram  white · 1 of 1 slices shown (7 of 11)]
[im 1/1]
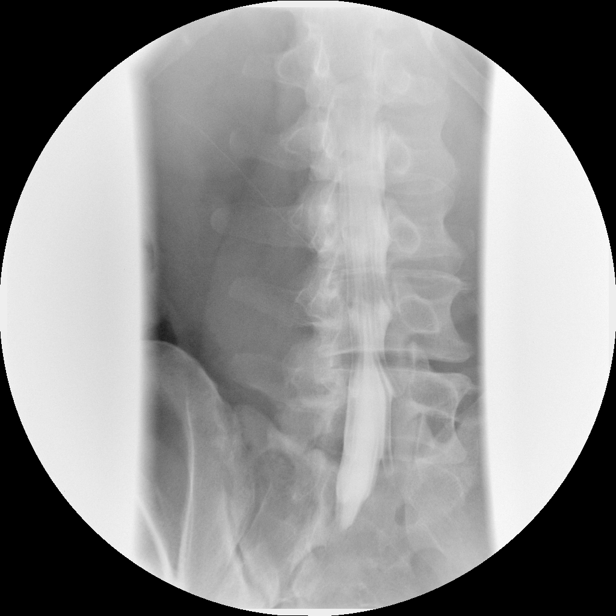

[Series 11: myelogram  white · 1 of 1 slices shown (8 of 11)]
[im 1/1]
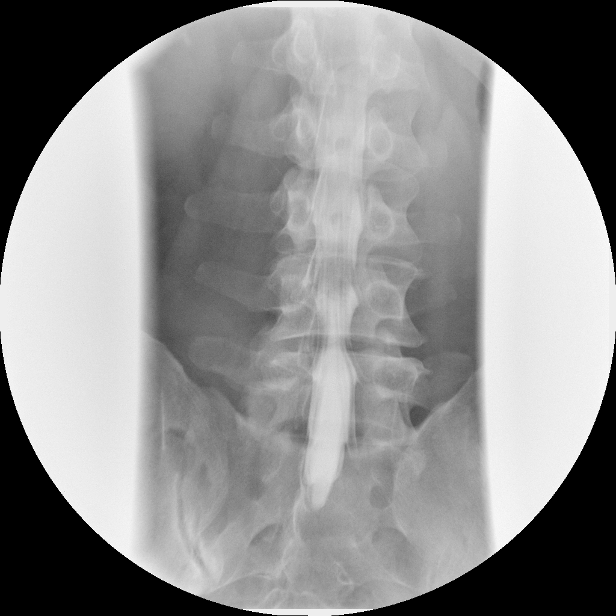

[Series 12: myelogram  white · 1 of 1 slices shown (9 of 11)]
[im 1/1]
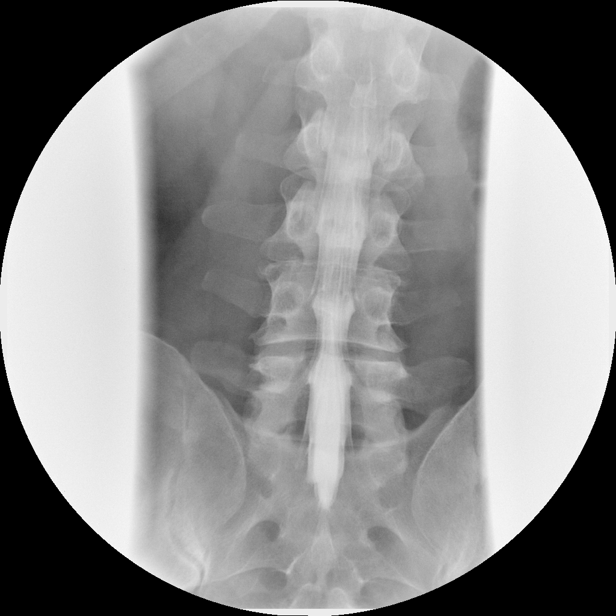

[Series 14: myelogram  white · 1 of 1 slices shown (10 of 11)]
[im 1/1]
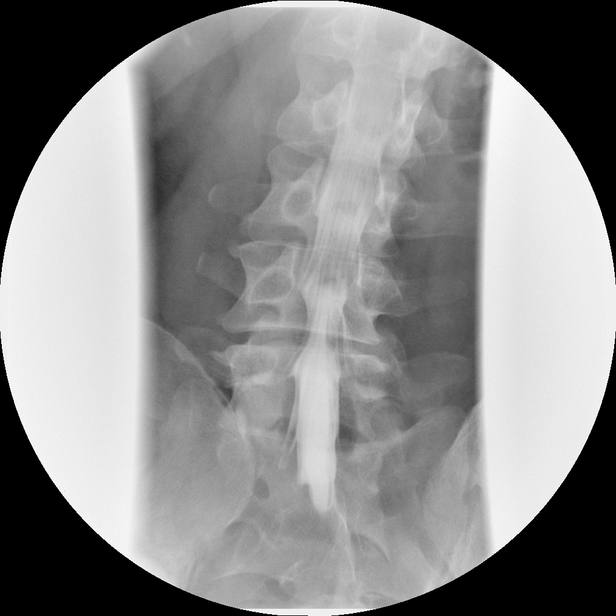

[Series 15: myelogram  white · 1 of 1 slices shown (11 of 11)]
[im 1/1]
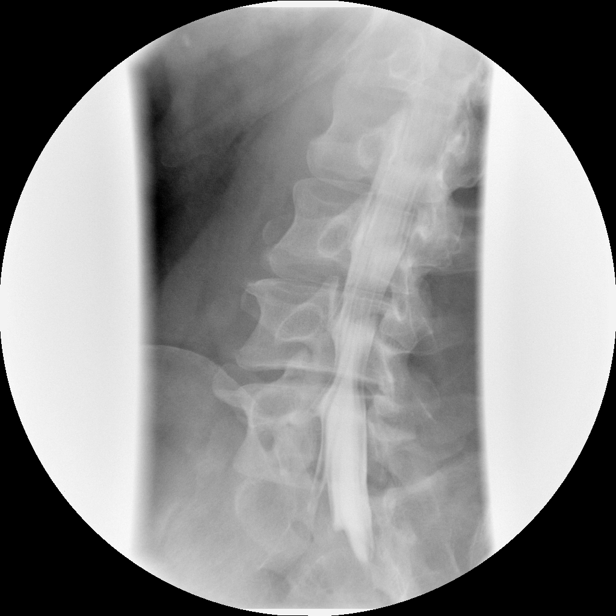

[12 of 15 positions shown; findings below may reference images not displayed]

EXAM:
LUMBAR MYELOGRAM

FLUOROSCOPY TIME:  1 minutes and 34 seconds.  Fifteen spot films.

PROCEDURE:
After thorough discussion of risks and benefits of the procedure
including bleeding, infection, injury to nerves, blood vessels,
adjacent structures as well as headache and CSF leak, written and
oral informed consent was obtained. Consent was obtained by Dr. Okieh
Dzu. Time out form was completed.

Patient was positioned prone on the fluoroscopy table. Local
anesthesia was provided with 1% lidocaine without epinephrine after
prepped and draped in the usual sterile fashion. Puncture was
performed at L5-S1 using a 5 inch 22-gauge spinal needle via midline
approach. Using a single pass through the dura, the needle was
placed within the thecal sac, with return of clear CSF. 15 mL of
1mnipaque-SFB was injected into the thecal sac, with normal
opacification of the nerve roots and cauda equina consistent with
free flow within the subarachnoid space.

I personally performed the lumbar puncture and administered the
intrathecal contrast. I also personally supervised acquisition of
the myelogram images.
FINDINGS: LUMBAR MYELOGRAM FINDINGS:

Good opacification lumbar subarachnoid space. Moderate disc space
narrowing at L4-5. Severe spinal stenosis at L3-4. Mild to moderate
spinal stenosis at L4-5. Anatomic alignment. Large ventral defect at
L3-4. Smaller ventral defects at L2-3 and L4-5. BILATERAL L4 nerve
root effacement is present. Only mild BILATERAL L5 nerve root
effacement is seen. There is trace retrolisthesis at L3-4

With patient standing, the stenosis at L3-4 and L4-5 become worse.
There is near complete block at L3-4 with patient standing.
Increased RIGHT greater than LEFT L4 and L5 nerve root impingement
are observed at the L3-4 and L4-5 levels respectively.

With side-to-side bending, there is increased stenosis and
subarticular zone narrowing at both L3-4 and L4-5 with bending to
the RIGHT and LEFT.

With patient standing in lateral lateral, there is increasing
prominence of the protrusion of L3-4. 2 mm of retrolisthesis
develops with patient standing in neutral at L3-4. This increases to
3 mm in extension. This reduces in flexion.

There is anatomic alignment at L4-5 with patient prone for
myelography, but slight retrolisthesis develops in neutral standing,
measuring 2 mm, increasing to 3 mm in extension.

In extension, there is also increased stenosis at L3-4 and L4-5 due
to ligamentum flavum infolding. Congenitally short pedicles are also
contributory.

CT LUMBAR MYELOGRAM FINDINGS:

Segmentation: Normal.

Alignment: With the patient supine for myelography, the alignment is
normal except for 2 mm retrolisthesis at L3-4.

Vertebrae: No worrisome osseous lesion.Congenitally short pedicles
contribute to stenosis

Conus medullaris: Normal in size and location.

Paraspinal tissues: No evidence for hydronephrosis or paravertebral
mass.

Disc levels:

L1-L2:  Normal.

L2-L3: Mild bulge. Marked extraforaminal spurring on the RIGHT. No
subarticular zone or foraminal zone narrowing, nor spinal stenosis,
but the RIGHT L2 nerve root may be irritated in the extraforaminal
compartment.

L3-L4: Congenital and acquired stenosis related to broad-based
calcified disc protrusion, osseous ridging, 2 mm retrolisthesis, and
posterior element hypertrophy. Moderate stenosis is present with
RIGHT greater than LEFT L4 nerve root encroachment in the canal.
Mild foraminal narrowing does not clearly affect the L3 nerve roots.

L4-L5: Normal alignment. Broad-based central protrusion with
components extending into the foraminal and extraforaminal
compartment also associated with osseous ridging. Mild stenosis with
congenitally short pedicles. Mild facet arthropathy. BILATERAL L4
nerve root impingement in the foramina, equally severe RIGHT versus
LEFT. Significant L5 nerve root impingement not appreciated.

L5-S1: Normal disc space. Mild facet arthropathy. No subarticular
zone or foraminal zone narrowing.
IMPRESSION: LUMBAR MYELOGRAM IMPRESSION:

Congenital and acquired stenosis, L3-4 greater than L4-5.

Mild dynamic instability at both L3-4 and L4-5, with slight
retrolisthesis developing or increasing with the patient standing.
Side to side bending also increases the degree of stenosis at L3-4
and L4-5.

Stenosis is most severe at L3-4 with patient standing in extension,
where ligamentum flavum infolding compounds short pedicles and
worsening disc pathology.

CT LUMBAR MYELOGRAM IMPRESSION:

Congenital and acquired moderate stenosis at L3-4 relates to
calcified central protrusion, posterior element hypertrophy, short
pedicles, and retrolisthesis RIGHT greater than LEFT L4 nerve root
encroachment canal. Mild foraminal narrowing does not clearly affect
the L3 nerve roots.

Congenital and acquired mild stenosis at L4-5 relates to broad-based
central protrusion, mild facet arthropathy, congenitally short
pedicles. No significant subarticular zone narrowing affecting the
L5 nerve roots, but BILATERAL foraminal narrowing appears compress
both L4 nerve roots.

Extraforaminal spurring at L2-3 on the RIGHT. Correlate clinically
for RIGHT L2 nerve root irritation.

## 2016-10-31 DIAGNOSIS — Z79899 Other long term (current) drug therapy: Secondary | ICD-10-CM | POA: Diagnosis not present

## 2016-10-31 DIAGNOSIS — R739 Hyperglycemia, unspecified: Secondary | ICD-10-CM | POA: Diagnosis not present

## 2016-10-31 DIAGNOSIS — I10 Essential (primary) hypertension: Secondary | ICD-10-CM | POA: Diagnosis not present

## 2016-11-05 DIAGNOSIS — L304 Erythema intertrigo: Secondary | ICD-10-CM | POA: Diagnosis not present

## 2016-11-05 DIAGNOSIS — L409 Psoriasis, unspecified: Secondary | ICD-10-CM | POA: Diagnosis not present

## 2016-11-05 DIAGNOSIS — K130 Diseases of lips: Secondary | ICD-10-CM | POA: Diagnosis not present

## 2016-12-17 DIAGNOSIS — L409 Psoriasis, unspecified: Secondary | ICD-10-CM | POA: Diagnosis not present

## 2016-12-17 DIAGNOSIS — I10 Essential (primary) hypertension: Secondary | ICD-10-CM | POA: Diagnosis not present

## 2016-12-17 DIAGNOSIS — H9319 Tinnitus, unspecified ear: Secondary | ICD-10-CM | POA: Diagnosis not present

## 2017-07-28 DIAGNOSIS — I10 Essential (primary) hypertension: Secondary | ICD-10-CM | POA: Diagnosis not present

## 2017-07-28 DIAGNOSIS — Z79899 Other long term (current) drug therapy: Secondary | ICD-10-CM | POA: Diagnosis not present

## 2017-07-28 DIAGNOSIS — R7303 Prediabetes: Secondary | ICD-10-CM | POA: Diagnosis not present

## 2018-01-26 DIAGNOSIS — R7309 Other abnormal glucose: Secondary | ICD-10-CM | POA: Diagnosis not present

## 2018-01-26 DIAGNOSIS — I10 Essential (primary) hypertension: Secondary | ICD-10-CM | POA: Diagnosis not present

## 2018-01-26 DIAGNOSIS — E782 Mixed hyperlipidemia: Secondary | ICD-10-CM | POA: Diagnosis not present

## 2018-04-16 DIAGNOSIS — E782 Mixed hyperlipidemia: Secondary | ICD-10-CM | POA: Diagnosis not present

## 2018-04-16 DIAGNOSIS — Z79899 Other long term (current) drug therapy: Secondary | ICD-10-CM | POA: Diagnosis not present

## 2018-04-16 DIAGNOSIS — R7309 Other abnormal glucose: Secondary | ICD-10-CM | POA: Diagnosis not present

## 2018-07-23 DIAGNOSIS — E785 Hyperlipidemia, unspecified: Secondary | ICD-10-CM | POA: Diagnosis not present

## 2018-07-23 DIAGNOSIS — R7309 Other abnormal glucose: Secondary | ICD-10-CM | POA: Diagnosis not present

## 2018-07-23 DIAGNOSIS — K219 Gastro-esophageal reflux disease without esophagitis: Secondary | ICD-10-CM | POA: Diagnosis not present

## 2018-07-23 DIAGNOSIS — I10 Essential (primary) hypertension: Secondary | ICD-10-CM | POA: Diagnosis not present

## 2018-10-25 ENCOUNTER — Encounter (INDEPENDENT_AMBULATORY_CARE_PROVIDER_SITE_OTHER): Payer: Self-pay

## 2018-10-25 ENCOUNTER — Ambulatory Visit: Payer: 59 | Admitting: Gastroenterology

## 2018-10-25 ENCOUNTER — Encounter: Payer: Self-pay | Admitting: Gastroenterology

## 2018-10-25 VITALS — BP 134/76 | HR 92 | Ht 70.5 in | Wt 282.1 lb

## 2018-10-25 DIAGNOSIS — K6289 Other specified diseases of anus and rectum: Secondary | ICD-10-CM

## 2018-10-25 DIAGNOSIS — K625 Hemorrhage of anus and rectum: Secondary | ICD-10-CM

## 2018-10-25 NOTE — Progress Notes (Signed)
[facility] Consult Note:  History: [name] 10/25/2018  Referring physician: Aura Dials, MD  Reason for consult/chief complaint: Hemorrhoids and Rectal Bleeding (intermittent BRB on the toilet paper )   Subjective  HPI:  This is a very pleasant 48 year old man self-referred for anal bleeding.  He was last seen in June 2013 with a history of anal fissure requiring surgery in 2009.  The patient was having a persistent draining fistula.  Colonoscopy June 2013 described internal hemorrhoids, no fissure or fistula seen.    He sits a lot as a Administrator.  Sometimes at the end of the day he feels there is some moisture perianal area, and when he wipes there might be a small streak of blood. He does not have frank rectal bleeding, abdominal pain, constipation or diarrhea.  He has chronic reflux symptoms with regurgitation and pyrosis, mostly under control on medicine and diet.  He denies dysphagia, odynophagia, vomiting, early satiety or weight loss.  ROS:  Review of Systems  Constitutional: Negative for appetite change and unexpected weight change.  HENT: Negative for mouth sores and voice change.   Eyes: Negative for pain and redness.  Respiratory: Negative for cough and shortness of breath.   Cardiovascular: Negative for chest pain and palpitations.  Genitourinary: Negative for dysuria and hematuria.  Musculoskeletal: Positive for back pain. Negative for arthralgias and myalgias.  Skin: Negative for pallor and rash.  Neurological: Negative for weakness and headaches.  Hematological: Negative for adenopathy.  Psychiatric/Behavioral:       Anxiety     Past Medical History: Past Medical History:  Diagnosis Date  . Allergic rhinitis   . Anal fissure   . Anxiety   . Depression   . Family history of adverse reaction to anesthesia    mother - hard awaken  . Gastric polyp 03/2005   Benign  . GERD (gastroesophageal reflux disease)   . H.  pylori infection   . Headache(784.0)   . Hyperlipidemia 06/04/2012  . Hypertension   . IBS (irritable bowel syndrome)   . Insomnia   . Plantar fasciitis   . PONV (postoperative nausea and vomiting)   . Pre-diabetes      Past Surgical History: Past Surgical History:  Procedure Laterality Date  . ANAL FISSURE REPAIR  2003  . LUMBAR LAMINECTOMY/DECOMPRESSION MICRODISCECTOMY N/A 09/20/2015   Procedure: LUMBAR LAMINECTOMY/DECOMPRESSION MICRODISCECTOMY 2 LEVELS L3-4 L4-5;  Surgeon: Susa Day, MD;  Location: [facility];  Service: Orthopedics;  Laterality: N/A;  . ROTATOR CUFF REPAIR    . VASECTOMY       Family History: Family History  Problem Relation Age of Onset  . Hypertension Mother   . Diabetes Mother     Social History: Social History   Socioeconomic History  . Marital status: Married    Spouse name: Not on file  . Number of children: 2  . Years of education: Not on file  . Highest education level: Not on file  Occupational History  . Occupation: Truck Education administrator: [employer]  . Financial resource strain: Not on file  . Food insecurity:    Worry: Not on file    Inability: Not on file  . Transportation needs:    Medical: Not on file    Non-medical: Not on file  Tobacco Use  . Smoking status: Never Smoker  . Smokeless tobacco: Never Used  Substance and Sexual Activity  . Alcohol use: Yes    Alcohol/week: 1.0  standard drinks    Types: 1 Cans of beer per week    Comment: socially  . Drug use: No  . Sexual activity: Not on file  Lifestyle  . Physical activity:    Days per week: Not on file    Minutes per session: Not on file  . Stress: Not on file  Relationships  . Social connections:    Talks on phone: Not on file    Gets together: Not on file    Attends religious service: Not on file    Active member of club or organization: Not on file    Attends meetings of clubs or organizations: Not on file    Relationship status: Not on file    Other Topics Concern  . Not on file  Social History Narrative  . Not on file    Allergies: Allergies  Allergen Reactions  . Hydrocodone     Headache, hypersensitivity    Outpatient Meds: Current Outpatient Medications  Medication Sig Dispense Refill  . ALPRAZolam (XANAX) 1 MG tablet Take 1 mg by mouth at bedtime as needed for anxiety or sleep.     Marland Kitchen atorvastatin (LIPITOR) 20 MG tablet Take 1 tablet by mouth daily.    Marland Kitchen lisinopril-hydrochlorothiazide (PRINZIDE,ZESTORETIC) 20-25 MG tablet TAKE 1 TABLET BY MOUTH EVERY DAY 90 tablet 0  . Menthol (HALLS COUGH DROPS MT) Use as directed in the mouth or throat as needed.    . methocarbamol (ROBAXIN) 500 MG tablet Take 1 tablet (500 mg total) by mouth every 8 (eight) hours as needed for muscle spasms. 60 tablet 1  . naproxen sodium (ANAPROX) 220 MG tablet Take 220 mg by mouth 2 (two) times daily with a meal.    . omeprazole (PRILOSEC OTC) 20 MG tablet Take 20 mg by mouth daily.     No current facility-administered medications for this visit.       ___________________________________________________________________ Objective   Exam:  BP 134/76 (BP Location: Left Arm, Patient Position: Sitting, Cuff Size: Large)   Pulse 92   Ht 5' 10.5" (1.791 m) Comment: height measured without shoes  Wt 282 lb 2 oz (128 kg)   BMI 39.91 kg/m    General: Well-appearing man  Eyes: sclera anicteric, no redness  ENT: oral mucosa moist without lesions, no cervical or supraclavicular lymphadenopathy  CV: RRR without murmur, S1/S2, no JVD, no peripheral edema  Resp: clear to auscultation bilaterally, normal RR and effort noted  GI: soft, no tenderness, with active bowel sounds. No guarding or palpable organomegaly noted, limited by body habitus.  Skin; warm and dry, no rash or jaundice noted  Neuro: awake, alert and oriented x 3. Normal gross motor function and fluent speech Rectal: Normal perianal exam, DRE normal with no fissure,  tenderness or palpable internal lesion. There is no fistula or other draining area in the perianal area.   Assessment: Encounter Diagnoses  Name Primary?  Marland Kitchen Anal bleeding Yes  . Perianal pain     He appears to be getting intermittent local perianal skin irritation causing some benign anal bleeding from the external hemorrhoid plexus.  Recommendations for hygiene given.  Return as needed.  Thank you for the courtesy of this consult.  Please call me with any questions or concerns.  Nelida Meuse III  CC: Referring provider noted above

## 2018-10-25 NOTE — Patient Instructions (Signed)
If you are age 49 or older, your body mass index should be between 23-30. Your Body mass index is 39.91 kg/m. If this is out of the aforementioned range listed, please consider follow up with your Primary Care Provider.  If you are age 54 or younger, your body mass index should be between 19-25. Your Body mass index is 39.91 kg/m. If this is out of the aformentioned range listed, please consider follow up with your Primary Care Provider.   Follow up as needed.   It was a pleasure to see you today!  Dr. Loletha Carrow

## 2019-02-28 ENCOUNTER — Encounter: Payer: Self-pay | Admitting: *Deleted

## 2019-12-29 ENCOUNTER — Encounter: Payer: Self-pay | Admitting: Nurse Practitioner

## 2019-12-29 ENCOUNTER — Ambulatory Visit: Payer: 59 | Admitting: Nurse Practitioner

## 2019-12-29 ENCOUNTER — Other Ambulatory Visit: Payer: Self-pay

## 2019-12-29 VITALS — BP 130/84 | HR 85 | Temp 97.7°F | Ht 70.5 in | Wt 282.0 lb

## 2019-12-29 DIAGNOSIS — J302 Other seasonal allergic rhinitis: Secondary | ICD-10-CM

## 2019-12-29 DIAGNOSIS — I10 Essential (primary) hypertension: Secondary | ICD-10-CM | POA: Diagnosis not present

## 2019-12-29 DIAGNOSIS — R7303 Prediabetes: Secondary | ICD-10-CM | POA: Diagnosis not present

## 2019-12-29 DIAGNOSIS — E782 Mixed hyperlipidemia: Secondary | ICD-10-CM

## 2019-12-29 NOTE — Patient Instructions (Signed)
Increase your physical activity to at least 10,000 steps per day Stop taking the over the counter allergy medicine Once we see what your HgbA1c is we can decide on a course of action Limit intake of breads and carbs

## 2019-12-29 NOTE — Progress Notes (Signed)
This visit occurred during the SARS-CoV-2 public health emergency.  Safety protocols were in place, including screening questions prior to the visit, additional usage of staff PPE, and extensive cleaning of exam room while observing appropriate contact time as indicated for disinfecting solutions.  Subjective:     Patient ID: [name] , male    DOB: 04/28/1971 , 49 y.o.   MRN: [id number]   Chief Complaint  Patient presents with  . Establish Care    allergies    HPI  Here to establish care - he had been going to [facility] - last seen for his chronic illnesses in June. 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  Married.  2 children - healthy.  He has been under increased stress with his mother being ill  PMH - prediabetes (last HgbA1c 6.1 in 02/2019).  HTN - 4-5 years the only medication he has taken lisinopril/HCTZ.  Seasonal allergies - taking Xyzal/Flonase, will have sinus issues. Had a fistula in 2013 - had repaired and colonoscopy at that time. Had a ruptured disc 02/2015 - he feels like he is back to   [facility] - mother - diabetes, HTN, kidney failure also has a leg amputation [place], [place]). 2 brothers - healthy.   ETOH - weekends (1-2 bottles beer), Non-smoker. No illicit drug use.   Wt Readings from Last 3 Encounters: 12/29/19 : 282 lb (127.9 kg) 10/25/18 : 282 lb 2 oz (128 kg) 09/20/15 : 268 lb (121.6 kg)  Itching eyes,  - he is taking flonase and xyzal.  He is drinking 1-2 (64oz) of water a day.  He sleeps with a fan nightly that he puts in front of his face at night.  He is taking aleve daily - to avoid being sore.   Hypertension This is a chronic problem. The current episode started more than 1 year ago. The problem is unchanged. The problem is controlled. Pertinent negatives include no anxiety, chest pain or palpitations. There are no associated agents to hypertension. Risk factors for coronary artery disease include obesity, sedentary lifestyle and male gender. Past  treatments include diuretics and ACE inhibitors. There are no compliance problems.  There is no history of angina or kidney disease. There is no history of chronic renal disease.  Diabetes He presents for his follow-up diabetic visit. Diabetes type: prediabetes. There are no hypoglycemic associated symptoms. Pertinent negatives for diabetes include no chest pain. There are no hypoglycemic complications. There are no diabetic complications. Risk factors for coronary artery disease include obesity, sedentary lifestyle, hypertension and male sex. When asked about current treatments, none were reported. His weight is increasing steadily. Diabetic current diet: low sugar. Exercise: exercises at work, aims to get at least 10,000 steps a day. An ACE inhibitor/angiotensin II receptor blocker is being taken. He does not see a podiatrist.Eye exam is not current.     Past Medical History:  Diagnosis Date  . Allergic rhinitis   . Anal fissure   . Anxiety   . Depression   . Family history of adverse reaction to anesthesia    mother - hard awaken  . Gastric polyp 03/2005   Benign  . GERD (gastroesophageal reflux disease)   . H. pylori infection   . Headache(784.0)   . Hyperlipidemia 06/04/2012  . Hypertension   . IBS (irritable bowel syndrome)   . Insomnia   . Plantar fasciitis   . PONV (postoperative nausea and vomiting)   . Pre-diabetes      Family  History  Problem Relation Age of Onset  . Hypertension Mother   . Diabetes Mother      Current Outpatient Medications:  .  ALPRAZolam (XANAX) 1 MG tablet, Take 1 mg by mouth at bedtime as needed for anxiety or sleep. , Disp: , Rfl:  .  atorvastatin (LIPITOR) 20 MG tablet, Take 1 tablet by mouth daily., Disp: , Rfl:  .  cholecalciferol (VITAMIN D3) 25 MCG (1000 UNIT) tablet, Take 1,000 Units by mouth daily., Disp: , Rfl:  .  lisinopril-hydrochlorothiazide (PRINZIDE,ZESTORETIC) 20-25 MG tablet, TAKE 1 TABLET BY MOUTH EVERY DAY, Disp: 90 tablet, Rfl:  0 .  omeprazole (PRILOSEC OTC) 20 MG tablet, Take 20 mg by mouth daily., Disp: , Rfl:  .  traMADol (ULTRAM) 50 MG tablet, Take by mouth every 6 (six) hours as needed., Disp: , Rfl:  .  zinc gluconate 50 MG tablet, Take 50 mg by mouth daily., Disp: , Rfl:    Allergies  Allergen Reactions  . Hydrocodone     Headache, hypersensitivity     Review of Systems  Constitutional: Negative.   Respiratory: Negative.   Cardiovascular: Negative.  Negative for chest pain, palpitations and leg swelling.  Psychiatric/Behavioral: Negative.      Today's Vitals   12/29/19 0853  BP: 130/84  Pulse: 85  Temp: 97.7 F (36.5 C)  TempSrc: Oral  SpO2: 95%  Weight: 282 lb (127.9 kg)  Height: 5' 10.5" (1.791 m)   Body mass index is 39.89 kg/m.   Objective:  Physical Exam Vitals reviewed.  Constitutional:      General: He is not in acute distress.    Appearance: Normal appearance. He is obese.  HENT:     Right Ear: Hearing normal.     Left Ear: Hearing normal.     Nose: Nose normal.     Right Turbinates: Enlarged.     Left Turbinates: Enlarged.     Right Sinus: No frontal sinus tenderness.     Left Sinus: No frontal sinus tenderness.     Comments: Reports stuffiness to right nostril and congestion at night Eyes:     General: Lids are normal.     Conjunctiva/sclera: Conjunctivae normal.  Cardiovascular:     Rate and Rhythm: Normal rate and regular rhythm.     Pulses: Normal pulses.     Heart sounds: Normal heart sounds. No murmur.  Pulmonary:     Effort: No respiratory distress.     Breath sounds: Normal breath sounds.  Musculoskeletal:        General: No swelling.  Skin:    General: Skin is warm and dry.     Capillary Refill: Capillary refill takes less than 2 seconds.  Neurological:     General: No focal deficit present.     Mental Status: He is alert and oriented to person, place, and time.  Psychiatric:        Mood and Affect: Mood normal.        Behavior: Behavior normal.         Thought Content: Thought content normal.        Judgment: Judgment normal.         Assessment And Plan:     1. Prediabetes  He reports a history of prediabetes, according to his records from [place] his HgbA1c in June 2020 is 6.1  Discussed the importance to avoid sugary foods and drinks, also avoid high carbohydrate foods  Increase physical activity  2. Mixed hyperlipidemia  No current  medications  I will check lipid panel  3. Essential hypertension  Chronic, fair control  Continue with his current medications  4. Seasonal allergies  Advised to avoid taking Afrin type nasal spray  Given samples of Ryvent   Continue with flonase and xyzal    [name], FNP    THE PATIENT IS ENCOURAGED TO PRACTICE SOCIAL DISTANCING DUE TO THE COVID-19 PANDEMIC.

## 2019-12-29 NOTE — Addendum Note (Signed)
Addended by: [name] F on: 12/29/2019 04:22 PM   Modules accepted: Orders

## 2019-12-30 LAB — CMP14+EGFR
ALT: 40 IU/L (ref 0–44)
AST: 26 IU/L (ref 0–40)
Albumin/Globulin Ratio: 2 (ref 1.2–2.2)
Albumin: 4.8 g/dL (ref 4.0–5.0)
Alkaline Phosphatase: 76 IU/L (ref 39–117)
BUN/Creatinine Ratio: 17 (ref 9–20)
BUN: 16 mg/dL (ref 6–24)
Bilirubin Total: 0.4 mg/dL (ref 0.0–1.2)
CO2: 24 mmol/L (ref 20–29)
Calcium: 9.5 mg/dL (ref 8.7–10.2)
Chloride: 104 mmol/L (ref 96–106)
Creatinine, Ser: 0.92 mg/dL (ref 0.76–1.27)
GFR calc Af Amer: 112 mL/min/{1.73_m2} (ref >59)
GFR calc non Af Amer: 97 mL/min/{1.73_m2} (ref >59)
Globulin, Total: 2.4 g/dL (ref 1.5–4.5)
Glucose: 111 mg/dL — ABNORMAL HIGH (ref 65–99)
Potassium: 4.3 mmol/L (ref 3.5–5.2)
Sodium: 141 mmol/L (ref 134–144)
Total Protein: 7.2 g/dL (ref 6.0–8.5)

## 2019-12-30 LAB — LIPID PANEL
Chol/HDL Ratio: 3.2 ratio (ref 0.0–5.0)
Cholesterol, Total: 103 mg/dL (ref 100–199)
HDL: 32 mg/dL — ABNORMAL LOW (ref >39)
LDL Chol Calc (NIH): 57 mg/dL (ref 0–99)
Triglycerides: 66 mg/dL (ref 0–149)
VLDL Cholesterol Cal: 14 mg/dL (ref 5–40)

## 2019-12-30 LAB — HEMOGLOBIN A1C
Est. average glucose Bld gHb Est-mCnc: 134 mg/dL
Hgb A1c MFr Bld: 6.3 % — ABNORMAL HIGH (ref 4.8–5.6)

## 2020-01-03 ENCOUNTER — Other Ambulatory Visit: Payer: Self-pay

## 2020-01-03 MED ORDER — METFORMIN HCL 500 MG PO TABS: 500.0000 mg | ORAL_TABLET | Freq: Every day | ORAL | 11 refills | Status: DC

## 2020-01-10 ENCOUNTER — Other Ambulatory Visit: Payer: Self-pay | Admitting: Nurse Practitioner

## 2020-01-10 DIAGNOSIS — H9313 Tinnitus, bilateral: Secondary | ICD-10-CM

## 2020-01-10 MED ORDER — ALPRAZOLAM 1 MG PO TABS: 1.0000 mg | ORAL_TABLET | Freq: Every evening | ORAL | 3 refills | Status: DC | PRN

## 2020-01-10 NOTE — Progress Notes (Signed)
I can send him a nasal spray called azestaline if he is interested.  I want to avoid any steroids because he has prediabetes

## 2020-01-10 NOTE — Progress Notes (Signed)
I have sent a Rx for alprazolam for the patient due to his tinnitus and insomnia.  Will get the records from his previous ENT

## 2020-01-11 ENCOUNTER — Other Ambulatory Visit: Payer: Self-pay | Admitting: Nurse Practitioner

## 2020-01-11 DIAGNOSIS — J302 Other seasonal allergic rhinitis: Secondary | ICD-10-CM

## 2020-01-11 MED ORDER — AZELASTINE HCL 0.1 % NA SOLN: 2.0000 | Freq: Two times a day (BID) | NASAL | 12 refills | Status: DC

## 2020-03-06 ENCOUNTER — Ambulatory Visit: Payer: Self-pay | Admitting: Nurse Practitioner

## 2020-03-07 ENCOUNTER — Other Ambulatory Visit: Payer: Self-pay

## 2020-03-07 ENCOUNTER — Encounter: Payer: Self-pay | Admitting: Nurse Practitioner

## 2020-03-07 ENCOUNTER — Ambulatory Visit: Payer: 59 | Admitting: Nurse Practitioner

## 2020-03-07 VITALS — BP 128/72 | HR 64 | Temp 98.1°F | Ht 70.5 in | Wt 280.4 lb

## 2020-03-07 DIAGNOSIS — R7303 Prediabetes: Secondary | ICD-10-CM | POA: Diagnosis not present

## 2020-03-07 DIAGNOSIS — I10 Essential (primary) hypertension: Secondary | ICD-10-CM

## 2020-03-07 DIAGNOSIS — E782 Mixed hyperlipidemia: Secondary | ICD-10-CM | POA: Diagnosis not present

## 2020-03-07 DIAGNOSIS — Z6839 Body mass index (BMI) 39.0-39.9, adult: Secondary | ICD-10-CM

## 2020-03-07 DIAGNOSIS — E6609 Other obesity due to excess calories: Secondary | ICD-10-CM

## 2020-03-07 DIAGNOSIS — Z1159 Encounter for screening for other viral diseases: Secondary | ICD-10-CM

## 2020-03-07 DIAGNOSIS — Z114 Encounter for screening for human immunodeficiency virus [HIV]: Secondary | ICD-10-CM

## 2020-03-07 DIAGNOSIS — E66812 Obesity, class 2: Secondary | ICD-10-CM

## 2020-03-07 MED ORDER — ATORVASTATIN CALCIUM 20 MG PO TABS: 20.0000 mg | ORAL_TABLET | Freq: Every day | ORAL | 1 refills | Status: DC

## 2020-03-07 MED ORDER — OMEPRAZOLE MAGNESIUM 20 MG PO TBEC: 20.0000 mg | DELAYED_RELEASE_TABLET | Freq: Every day | ORAL | 1 refills | Status: DC

## 2020-03-07 MED ORDER — LISINOPRIL-HYDROCHLOROTHIAZIDE 20-25 MG PO TABS: 1.0000 | ORAL_TABLET | Freq: Every day | ORAL | 0 refills | Status: DC

## 2020-03-07 NOTE — Progress Notes (Signed)
This visit occurred during the SARS-CoV-2 public health emergency.  Safety protocols were in place, including screening questions prior to the visit, additional usage of staff PPE, and extensive cleaning of exam room while observing appropriate contact time as indicated for disinfecting solutions.  Subjective:     Patient ID: [name] , male    DOB: 1970/10/16 , 49 y.o.   MRN: [id number]   Chief Complaint  Patient presents with  . Prediabetes    HPI  Presents today to follow up for pre diabetes. He lost his mother a couple weeks ago and is having a difficult time. He is sleeping and eating okay but did report a one day drinking binge but is doing much better. He is eating less sugar now and is eating vegetables, fruits, and red meats. We discussed his salt intake and he is doing much better and would like to see a dietician. He would like to lose about 20 pounds and he walks 10,000 steps a day and walk frequently at work and lifts heavy objects. He reports no side effects with the metformin and is compliant with all of his current medications.  Wt Readings from Last 3 Encounters: 03/07/20 : 280 lb 6.4 oz (127.2 kg) 12/29/19 : 282 lb (127.9 kg) 10/25/18 : 282 lb 2 oz (128 kg)  He reports a history of psoriasis who has treated him with methotrexate approximately one year ago.  He is interested in a cream.     Hypertension This is a chronic problem. The current episode started more than 1 year ago. The problem is unchanged. The problem is controlled. Pertinent negatives include no chest pain, headaches, palpitations or shortness of breath. There are no associated agents to hypertension. Risk factors for coronary artery disease include obesity, sedentary lifestyle and male gender. Past treatments include diuretics and ACE inhibitors. The current treatment provides mild improvement. There are no compliance problems.  There is no history of angina or kidney disease. There is no history of  chronic renal disease.  Diabetes He presents for his follow-up diabetic visit. Diabetes type: prediabetes. There are no hypoglycemic associated symptoms. Pertinent negatives for hypoglycemia include no headaches. Pertinent negatives for diabetes include no chest pain, no polydipsia, no polyphagia and no polyuria. There are no hypoglycemic complications. There are no diabetic complications. Risk factors for coronary artery disease include obesity, sedentary lifestyle, hypertension and male sex. When asked about current treatments, none were reported. His weight is increasing steadily. Diabetic current diet: low sugar. Exercise: exercises at work, aims to get at least 10,000 steps a day. An ACE inhibitor/angiotensin II receptor blocker is being taken. He does not see a podiatrist.Eye exam is not current.     Past Medical History:  Diagnosis Date  . Allergic rhinitis   . Anal fissure   . Anxiety   . Depression   . Family history of adverse reaction to anesthesia    mother - hard awaken  . Gastric polyp 03/2005   Benign  . GERD (gastroesophageal reflux disease)   . H. pylori infection   . Headache(784.0)   . Hyperlipidemia 06/04/2012  . Hypertension   . IBS (irritable bowel syndrome)   . Insomnia   . Plantar fasciitis   . PONV (postoperative nausea and vomiting)   . Pre-diabetes      Family History  Problem Relation Age of Onset  . Hypertension Mother   . Diabetes Mother      Current Outpatient Medications:  .  ALPRAZolam Duanne Moron)  1 MG tablet, Take 1 tablet (1 mg total) by mouth at bedtime as needed for anxiety or sleep., Disp: 30 tablet, Rfl: 3 .  cholecalciferol (VITAMIN D3) 25 MCG (1000 UNIT) tablet, Take 1,000 Units by mouth daily., Disp: , Rfl:  .  metFORMIN (GLUCOPHAGE) 500 MG tablet, Take 1 tablet (500 mg total) by mouth daily with breakfast., Disp: 30 tablet, Rfl: 11 .  traMADol (ULTRAM) 50 MG tablet, Take by mouth every 6 (six) hours as needed., Disp: , Rfl:  .  zinc  gluconate 50 MG tablet, Take 50 mg by mouth daily., Disp: , Rfl:  .  atorvastatin (LIPITOR) 20 MG tablet, Take 1 tablet (20 mg total) by mouth daily., Disp: 90 tablet, Rfl: 1 .  azelastine (ASTELIN) 0.1 % nasal spray, Place 2 sprays into both nostrils 2 (two) times daily. Use in each nostril as directed (Patient not taking: Reported on 03/06/2020), Disp: 30 mL, Rfl: 12 .  lisinopril-hydrochlorothiazide (ZESTORETIC) 20-25 MG tablet, Take 1 tablet by mouth daily., Disp: 90 tablet, Rfl: 0 .  omeprazole (PRILOSEC OTC) 20 MG tablet, Take 1 tablet (20 mg total) by mouth daily., Disp: 30 tablet, Rfl: 1   Allergies  Allergen Reactions  . Hydrocodone     Headache, hypersensitivity     Review of Systems  Constitutional: Negative.   HENT: Negative.   Respiratory: Negative.  Negative for shortness of breath.   Cardiovascular: Negative.  Negative for chest pain, palpitations and leg swelling.  Gastrointestinal: Negative.  Negative for constipation, diarrhea and nausea.  Endocrine: Negative.  Negative for polydipsia, polyphagia and polyuria.  Genitourinary: Negative.   Musculoskeletal: Negative.   Skin: Positive for rash (underneath right eye, scaly).  Neurological: Negative.  Negative for headaches.  Psychiatric/Behavioral: Negative.        Recently loss his mother two weeks ago     Today's Vitals   03/07/20 0843  BP: 128/72  Pulse: 64  Temp: 98.1 F (36.7 C)  TempSrc: Oral  Weight: 280 lb 6.4 oz (127.2 kg)  Height: 5' 10.5" (1.791 m)  PainSc: 0-No pain   Body mass index is 39.66 kg/m.   Objective:  Physical Exam Vitals reviewed.  Constitutional:      General: He is not in acute distress.    Appearance: Normal appearance. He is obese.  HENT:     Right Ear: Hearing normal.     Left Ear: Hearing normal.     Nose:     Right Turbinates: Enlarged.     Left Turbinates: Enlarged.     Right Sinus: No frontal sinus tenderness.     Left Sinus: No frontal sinus tenderness.     Comments:  Reports stuffiness to right nostril and congestion at night Eyes:     General: Lids are normal.  Cardiovascular:     Rate and Rhythm: Normal rate and regular rhythm.     Pulses: Normal pulses.     Heart sounds: Normal heart sounds. No murmur heard.   Pulmonary:     Effort: Pulmonary effort is normal. No respiratory distress.     Breath sounds: Normal breath sounds.  Musculoskeletal:        General: Normal range of motion.  Skin:    General: Skin is warm and dry.     Capillary Refill: Capillary refill takes less than 2 seconds.  Neurological:     General: No focal deficit present.     Mental Status: He is alert and oriented to person, place, and time.  Cranial Nerves: No cranial nerve deficit.  Psychiatric:        Mood and Affect: Mood and affect normal.        Behavior: Behavior normal.        Thought Content: Thought content normal.        Judgment: Judgment normal.         Assessment And Plan:    1. Prediabetes Chronic, will check HgbA1c today Continue with current medications pending results of her HgbA1c Encouraged to limit intake of sugary foods and drinks Encouraged to increase physical activity to 150 minutes per week - BMP8+eGFR - Hemoglobin A1c - Amb Ref to Medical Weight Management  2. Mixed hyperlipidemia  Chronic, controlled  Continue with current medications and tolerating well  3. Essential hypertension . B/P is well controlled.  . CMP ordered to check renal function.  . The importance of regular exercise and dietary modification was stressed to the patient.  . Stressed importance of losing ten percent of her body weight to help with B/P control.   4. Encounter for hepatitis C screening test for low risk patient  Will check Hepatitis C screening due to recent recommendations to screen all adults 18 years and older - Hepatitis C antibody  5. Encounter for screening for HIV  - HIV Antibody (routine testing w rflx)  6. Class 2 obesity due to  excess calories without serious comorbidity with body mass index (BMI) of 39.0 to 39.9 in adult  Chronic  Discussed healthy diet and regular exercise options   Encouraged to exercise at least 150 minutes per week with 2 days of strength training  Will refer to [facility] for weight management and nutritionist - Hemoglobin A1c  Marylu Lund, RN    THE PATIENT IS ENCOURAGED TO PRACTICE SOCIAL DISTANCING DUE TO THE COVID-19 PANDEMIC.

## 2020-03-07 NOTE — Patient Instructions (Signed)

## 2020-03-08 LAB — BMP8+EGFR
BUN/Creatinine Ratio: 20 (ref 9–20)
BUN: 18 mg/dL (ref 6–24)
CO2: 23 mmol/L (ref 20–29)
Calcium: 9.3 mg/dL (ref 8.7–10.2)
Chloride: 103 mmol/L (ref 96–106)
Creatinine, Ser: 0.92 mg/dL (ref 0.76–1.27)
GFR calc Af Amer: 112 mL/min/{1.73_m2} (ref >59)
GFR calc non Af Amer: 97 mL/min/{1.73_m2} (ref >59)
Glucose: 117 mg/dL — ABNORMAL HIGH (ref 65–99)
Potassium: 4.2 mmol/L (ref 3.5–5.2)
Sodium: 140 mmol/L (ref 134–144)

## 2020-03-08 LAB — HIV ANTIBODY (ROUTINE TESTING W REFLEX): HIV Screen 4th Generation wRfx: NONREACTIVE

## 2020-03-08 LAB — HEMOGLOBIN A1C
Est. average glucose Bld gHb Est-mCnc: 131 mg/dL
Hgb A1c MFr Bld: 6.2 % — ABNORMAL HIGH (ref 4.8–5.6)

## 2020-03-08 LAB — HEPATITIS C ANTIBODY: Hep C Virus Ab: <0.1 s/co ratio (ref 0.0–0.9)

## 2020-03-19 ENCOUNTER — Other Ambulatory Visit: Payer: Self-pay

## 2020-03-19 MED ORDER — METFORMIN HCL ER 500 MG PO TB24: 500.0000 mg | ORAL_TABLET | Freq: Two times a day (BID) | ORAL | 0 refills | Status: DC

## 2020-03-21 DIAGNOSIS — H9312 Tinnitus, left ear: Secondary | ICD-10-CM | POA: Insufficient documentation

## 2020-03-26 ENCOUNTER — Other Ambulatory Visit: Payer: Self-pay

## 2020-03-26 MED ORDER — OMEPRAZOLE MAGNESIUM 20 MG PO TBEC: 20.0000 mg | DELAYED_RELEASE_TABLET | Freq: Every day | ORAL | 1 refills | Status: DC

## 2020-03-26 MED ORDER — ATORVASTATIN CALCIUM 20 MG PO TABS: 20.0000 mg | ORAL_TABLET | Freq: Every day | ORAL | 1 refills | Status: DC

## 2020-03-26 MED ORDER — LISINOPRIL-HYDROCHLOROTHIAZIDE 20-25 MG PO TABS: 1.0000 | ORAL_TABLET | Freq: Every day | ORAL | 0 refills | Status: DC

## 2020-03-28 ENCOUNTER — Other Ambulatory Visit: Payer: Self-pay

## 2020-03-28 MED ORDER — OMEPRAZOLE 20 MG PO CPDR: 20.0000 mg | DELAYED_RELEASE_CAPSULE | Freq: Every day | ORAL | 0 refills | Status: DC

## 2020-03-29 ENCOUNTER — Telehealth: Payer: Self-pay

## 2020-03-29 ENCOUNTER — Other Ambulatory Visit: Payer: Self-pay | Admitting: Nurse Practitioner

## 2020-03-29 DIAGNOSIS — H9313 Tinnitus, bilateral: Secondary | ICD-10-CM

## 2020-03-29 DIAGNOSIS — R7303 Prediabetes: Secondary | ICD-10-CM

## 2020-03-29 MED ORDER — METFORMIN HCL ER 500 MG PO TB24: 500.0000 mg | ORAL_TABLET | Freq: Two times a day (BID) | ORAL | 0 refills | Status: DC

## 2020-03-29 MED ORDER — ALPRAZOLAM 1 MG PO TABS: 1.0000 mg | ORAL_TABLET | Freq: Every evening | ORAL | 3 refills | Status: DC | PRN

## 2020-03-29 NOTE — Telephone Encounter (Signed)
I called patient to see what he needed tramadol since we have never filled the prescription before he stated he just switched to a different pharmacy and gave them all the names of his meds but he does not need tramadol. Pt then stated that the metformin ER is still making him feel funny and that he is going to cut back to 1 tablet daily if that is ok. Pt advised that it is ok he just needs to be mindful of what he eats and exercise and when he comes back for his appointment if it is still bothering him we will switch to a different medication. YL,RMA

## 2020-04-19 ENCOUNTER — Other Ambulatory Visit: Payer: Self-pay | Admitting: Nurse Practitioner

## 2020-04-19 DIAGNOSIS — R7303 Prediabetes: Secondary | ICD-10-CM

## 2020-05-15 ENCOUNTER — Other Ambulatory Visit: Payer: Self-pay | Admitting: Nurse Practitioner

## 2020-05-15 DIAGNOSIS — H9313 Tinnitus, bilateral: Secondary | ICD-10-CM

## 2020-05-15 NOTE — Telephone Encounter (Signed)
Xanax refill 

## 2020-05-17 ENCOUNTER — Other Ambulatory Visit: Payer: Self-pay | Admitting: Nurse Practitioner

## 2020-05-17 ENCOUNTER — Other Ambulatory Visit: Payer: Self-pay

## 2020-05-17 ENCOUNTER — Telehealth: Payer: Self-pay

## 2020-05-17 DIAGNOSIS — H9313 Tinnitus, bilateral: Secondary | ICD-10-CM

## 2020-05-17 MED ORDER — ALPRAZOLAM 1 MG PO TABS: 1.0000 mg | ORAL_TABLET | Freq: Every evening | ORAL | 3 refills | Status: DC | PRN

## 2020-05-17 MED ORDER — OMEPRAZOLE MAGNESIUM 20 MG PO TBEC: 20.0000 mg | DELAYED_RELEASE_TABLET | Freq: Every day | ORAL | 1 refills | Status: DC

## 2020-05-17 NOTE — Telephone Encounter (Signed)
Pt called for an appt for an injury that happen at work, per JM pt needs to contact workers comp. Pt stated he did he was actually seen by them he thought he was better so he returned to work and they closed the case. Per Jm he needs to go back to them that way his injury would be covered by workers comp, pt stated he will

## 2020-05-21 ENCOUNTER — Encounter: Payer: Self-pay | Admitting: Nurse Practitioner

## 2020-05-21 ENCOUNTER — Ambulatory Visit: Payer: 59 | Admitting: Nurse Practitioner

## 2020-05-21 ENCOUNTER — Other Ambulatory Visit: Payer: Self-pay

## 2020-05-21 VITALS — BP 142/80 | HR 85 | Temp 98.1°F | Ht 69.4 in | Wt 268.4 lb

## 2020-05-21 DIAGNOSIS — M545 Low back pain, unspecified: Secondary | ICD-10-CM

## 2020-05-21 NOTE — Progress Notes (Signed)
I,[name],acting as a Education administrator for [employer], FNP.,have documented all relevant documentation on the behalf of Eddie Brine, FNP,as directed by  Eddie Brine, FNP while in the presence of [name], [name].  This visit occurred during the SARS-CoV-2 public health emergency.  Safety protocols were in place, including screening questions prior to the visit, additional usage of staff PPE, and extensive cleaning of exam room while observing appropriate contact time as indicated for disinfecting solutions.  Subjective:     Patient ID: [name] , male    DOB: Jun 26, 1971 , 49 y.o.   MRN: [id number]   Chief Complaint  Patient presents with  . Back Pain    HPI  Patient is here today for back pain from an accident at work. Patient was seen by [facility] on Friday August 3,2021. He was released by them and returned to work and his symptoms returned the next day. He was prescribed prednisolone dose pack by [facility] and has not completed this yet. Patient is currently out of work until they finish their investigation. Patient would like to go to Orthopedic to be evaluated. Patient made aware that we do not file Workman's Comp. Patient given information for walk-in orthopedic urgent care.     He is currently out of work.  He continued to have pain. He is working at [employer].  He was to bring a note from his job to be seen by [employer].    Back Pain This is a recurrent problem. The current episode started 1 to 4 weeks ago. The problem occurs 2 to 4 times per day. The problem has been waxing and waning since onset. The pain is present in the sacro-iliac and lumbar spine. The quality of the pain is described as burning. The pain radiates to the left foot. The pain is at a severity of 5/10. The pain is moderate. The pain is the same all the time. The symptoms are aggravated by position. Associated symptoms include tingling. Pertinent negatives include no chest pain. Risk factors include recent  trauma. He has tried ice and heat for the symptoms. The treatment provided mild relief.     Past Medical History:  Diagnosis Date  . Allergic rhinitis   . Anal fissure   . Anxiety   . Depression   . Family history of adverse reaction to anesthesia    mother - hard awaken  . Gastric polyp 03/2005   Benign  . GERD (gastroesophageal reflux disease)   . H. pylori infection   . Headache(784.0)   . Hyperlipidemia 06/04/2012  . Hypertension   . IBS (irritable bowel syndrome)   . Insomnia   . Plantar fasciitis   . PONV (postoperative nausea and vomiting)   . Pre-diabetes      Family History  Problem Relation Age of Onset  . Hypertension Mother   . Diabetes Mother      Current Outpatient Medications:  .  ALPRAZolam (XANAX) 1 MG tablet, Take 1 tablet (1 mg total) by mouth at bedtime as needed for anxiety or sleep., Disp: 30 tablet, Rfl: 3 .  atorvastatin (LIPITOR) 20 MG tablet, Take 1 tablet (20 mg total) by mouth daily., Disp: 90 tablet, Rfl: 1 .  lisinopril-hydrochlorothiazide (ZESTORETIC) 20-25 MG tablet, Take 1 tablet by mouth daily., Disp: 90 tablet, Rfl: 0 .  metFORMIN (GLUCOPHAGE-XR) 500 MG 24 hr tablet, TAKE ONE TABLET BY MOUTH TWICE A DAY, Disp: 60 tablet, Rfl: 0 .  omeprazole (PRILOSEC OTC) 20 MG tablet, Take 1  tablet (20 mg total) by mouth daily., Disp: 90 tablet, Rfl: 1   Allergies  Allergen Reactions  . Hydrocodone     Headache, hypersensitivity     Review of Systems  Constitutional: Negative.  Negative for fatigue.  Respiratory: Negative.  Negative for cough.   Cardiovascular: Negative.  Negative for chest pain, palpitations and leg swelling.  Musculoskeletal: Positive for back pain.  Neurological: Positive for tingling.     Today's Vitals   05/21/20 1201  BP: (!) 142/80  Pulse: 85  Temp: 98.1 F (36.7 C)  TempSrc: Oral  Weight: 268 lb 6.4 oz (121.7 kg)  Height: 5' 9.4" (1.763 m)  PainSc: 6    Body mass index is 39.18 kg/m.   Objective:  Physical  Exam Constitutional:      General: He is not in acute distress.    Appearance: Normal appearance.  Cardiovascular:     Rate and Rhythm: Normal rate and regular rhythm.     Pulses: Normal pulses.     Heart sounds: Normal heart sounds. No murmur heard.   Pulmonary:     Effort: Pulmonary effort is normal. No respiratory distress.     Breath sounds: Normal breath sounds.  Musculoskeletal:        General: No swelling, tenderness or deformity. Normal range of motion.  Neurological:     General: No focal deficit present.     Mental Status: He is alert and oriented to person, place, and time.     Cranial Nerves: No cranial nerve deficit.  Psychiatric:        Mood and Affect: Mood normal.        Behavior: Behavior normal.        Thought Content: Thought content normal.        Judgment: Judgment normal.         Assessment And Plan:     1. Acute midline low back pain without sciatica  During exam minimal pain illicited when raising right leg, felt on left low to mid back.  He is able to raise his leg with ease both sides.  I have advised him to go to [facility] walk in clinic this evening for more in depth orthopedic care. At this time with the discomfort he has my thoughts are he can go back to work however I have sent him to orthopedics for confirmation. I have encouraged him to stretch regularly and to walk short distances and try to avoid laying around as this may make his pain worse.    Patient was given opportunity to ask questions. Patient verbalized understanding of the plan and was able to repeat key elements of the plan. All questions were answered to their satisfaction.   Teola Bradley, FNP, have reviewed all documentation for this visit. The documentation on 05/21/20 for the exam, diagnosis, procedures, and orders are all accurate and complete.  THE PATIENT IS ENCOURAGED TO PRACTICE SOCIAL DISTANCING DUE TO THE COVID-19 PANDEMIC.

## 2020-05-21 NOTE — Patient Instructions (Signed)
Acute Back Pain, Adult Acute back pain is sudden and usually short-lived. It is often caused by an injury to the muscles and tissues in the back. The injury may result from:  A muscle or ligament getting overstretched or torn (strained). Ligaments are tissues that connect bones to each other. Lifting something improperly can cause a back strain.  Wear and tear (degeneration) of the spinal disks. Spinal disks are circular tissue that provides cushioning between the bones of the spine (vertebrae).  Twisting motions, such as while playing sports or doing yard work.  A hit to the back.  Arthritis. You may have a physical exam, lab tests, and imaging tests to find the cause of your pain. Acute back pain usually goes away with rest and home care. Follow these instructions at home: Managing pain, stiffness, and swelling  Take over-the-counter and prescription medicines only as told by your health care provider.  Your health care provider may recommend applying ice during the first 24-48 hours after your pain starts. To do this: ? Put ice in a plastic bag. ? Place a towel between your skin and the bag. ? Leave the ice on for 20 minutes, 2-3 times a day.  If directed, apply heat to the affected area as often as told by your health care provider. Use the heat source that your health care provider recommends, such as a moist heat pack or a heating pad. ? Place a towel between your skin and the heat source. ? Leave the heat on for 20-30 minutes. ? Remove the heat if your skin turns bright red. This is especially important if you are unable to feel pain, heat, or cold. You have a greater risk of getting burned. Activity   Do not stay in bed. Staying in bed for more than 1-2 days can delay your recovery.  Sit up and stand up straight. Avoid leaning forward when you sit, or hunching over when you stand. ? If you work at a desk, sit close to it so you do not need to lean over. Keep your chin tucked  in. Keep your neck drawn back, and keep your elbows bent at a right angle. Your arms should look like the letter "L." ? Sit high and close to the steering wheel when you drive. Add lower back (lumbar) support to your car seat, if needed.  Take short walks on even surfaces as soon as you are able. Try to increase the length of time you walk each day.  Do not sit, drive, or stand in one place for more than 30 minutes at a time. Sitting or standing for long periods of time can put stress on your back.  Do not drive or use heavy machinery while taking prescription pain medicine.  Use proper lifting techniques. When you bend and lift, use positions that put less stress on your back: ? Bend your knees. ? Keep the load close to your body. ? Avoid twisting.  Exercise regularly as told by your health care provider. Exercising helps your back heal faster and helps prevent back injuries by keeping muscles strong and flexible.  Work with a physical therapist to make a safe exercise program, as recommended by your health care provider. Do any exercises as told by your physical therapist. Lifestyle  Maintain a healthy weight. Extra weight puts stress on your back and makes it difficult to have good posture.  Avoid activities or situations that make you feel anxious or stressed. Stress and anxiety increase muscle   tension and can make back pain worse. Learn ways to manage anxiety and stress, such as through exercise. General instructions  Sleep on a firm mattress in a comfortable position. Try lying on your side with your knees slightly bent. If you lie on your back, put a pillow under your knees.  Follow your treatment plan as told by your health care provider. This may include: ? Cognitive or behavioral therapy. ? Acupuncture or massage therapy. ? Meditation or yoga. Contact a health care provider if:  You have pain that is not relieved with rest or medicine.  You have increasing pain going down  into your legs or buttocks.  Your pain does not improve after 2 weeks.  You have pain at night.  You lose weight without trying.  You have a fever or chills. Get help right away if:  You develop new bowel or bladder control problems.  You have unusual weakness or numbness in your arms or legs.  You develop nausea or vomiting.  You develop abdominal pain.  You feel faint. Summary  Acute back pain is sudden and usually short-lived.  Use proper lifting techniques. When you bend and lift, use positions that put less stress on your back.  Take over-the-counter and prescription medicines and apply heat or ice as directed by your health care provider. This information is not intended to replace advice given to you by your health care provider. Make sure you discuss any questions you have with your health care provider. Document Revised: 12/14/2018 Document Reviewed: 04/08/2017 Elsevier Patient Education  2020 Elsevier Inc.  

## 2020-05-24 ENCOUNTER — Other Ambulatory Visit: Payer: Self-pay | Admitting: Nurse Practitioner

## 2020-05-24 ENCOUNTER — Telehealth: Payer: Self-pay

## 2020-05-24 DIAGNOSIS — R7303 Prediabetes: Secondary | ICD-10-CM

## 2020-05-24 NOTE — Telephone Encounter (Signed)
Patient called stating he went to the orthopaedic and they told him that he has a pinched nerve and we may receive forms for workers comp and he also has a short term disability claim as well. I called pt the phone rang once and that was it. He needs to have the orthopaedic fill those forms out. YL,RMA

## 2020-05-30 ENCOUNTER — Telehealth: Payer: Self-pay

## 2020-05-30 NOTE — Telephone Encounter (Signed)
Received Short term Disability paper work for pt being out of work. Per JM pt needs to have the orthopedics office who took him out of work complete the forms. LVM on home and cell phone to make pt aware of this

## 2020-05-31 DIAGNOSIS — M545 Low back pain, unspecified: Secondary | ICD-10-CM

## 2020-05-31 HISTORY — DX: Low back pain, unspecified: M54.50

## 2020-06-04 ENCOUNTER — Other Ambulatory Visit: Payer: Self-pay | Admitting: Nurse Practitioner

## 2020-06-13 ENCOUNTER — Ambulatory Visit: Payer: 59 | Admitting: Nurse Practitioner

## 2020-06-26 ENCOUNTER — Other Ambulatory Visit: Payer: Self-pay | Admitting: Nurse Practitioner

## 2020-07-04 ENCOUNTER — Other Ambulatory Visit: Payer: Self-pay | Admitting: Nurse Practitioner

## 2020-07-26 ENCOUNTER — Telehealth: Payer: Self-pay | Admitting: Nurse Practitioner

## 2020-07-26 ENCOUNTER — Telehealth: Payer: Self-pay

## 2020-07-26 NOTE — Telephone Encounter (Signed)
I called patient and notified him that his form has been faxed to [place] [employer] at [phone] and I also made him an appt for mid dec for prediabetes f/u per [facility] Request. Tyler Deis

## 2020-07-26 NOTE — Telephone Encounter (Signed)
Called patient to inquire and clarify what was needed with his form he left.  He is taking alprazolam for tinnitus at night only.  I will also need to get his records from [facility] and Dr.Thacker to see what other options and treatment he has had.  He is aware to only take at night/bedtime.  He will also come in Dec for his prediabetes follow up since he has had 2 steroid injections.  Forms are complete and faxed to the numbers he provided

## 2020-08-02 ENCOUNTER — Other Ambulatory Visit: Payer: Self-pay | Admitting: Nurse Practitioner

## 2020-08-22 ENCOUNTER — Ambulatory Visit: Payer: 59 | Admitting: Nurse Practitioner

## 2020-08-22 ENCOUNTER — Other Ambulatory Visit: Payer: Self-pay

## 2020-08-22 ENCOUNTER — Encounter: Payer: Self-pay | Admitting: Nurse Practitioner

## 2020-08-22 VITALS — BP 116/80 | HR 70 | Temp 98.0°F | Ht 69.2 in | Wt 268.2 lb

## 2020-08-22 DIAGNOSIS — Z2821 Immunization not carried out because of patient refusal: Secondary | ICD-10-CM | POA: Diagnosis not present

## 2020-08-22 DIAGNOSIS — R7303 Prediabetes: Secondary | ICD-10-CM

## 2020-08-22 DIAGNOSIS — Z87820 Personal history of traumatic brain injury: Secondary | ICD-10-CM

## 2020-08-22 DIAGNOSIS — R413 Other amnesia: Secondary | ICD-10-CM

## 2020-08-22 NOTE — Progress Notes (Signed)
I,Yamilka Roman [employer] as a Education administrator for [employer], FNP.,have documented all relevant documentation on the behalf of [name], FNP,as directed by  [name], FNP while in the presence of [name], [place].  This visit occurred during the SARS-CoV-2 public health emergency.  Safety protocols were in place, including screening questions prior to the visit, additional usage of staff PPE, and extensive cleaning of exam room while observing appropriate contact time as indicated for disinfecting solutions.  Subjective:     Patient ID: [name] , male    DOB: [birth year] , 49 y.o.   MRN: [id number]   Chief Complaint  Patient presents with  . Prediabetes    HPI  Patient is here for a f/u on prediabetes. Patient stated he stopped taking the metformin (on his own without making the provider aware) and started taking bitter sour (homeopathic).  Continues to try to avoid eating high sugar foods and drinks. He has been incorporating salads more and will eat thin crust.   He has been off of the metformin for 2 weeks.   Wt Readings from Last 3 Encounters: 08/22/20 : 268 lb 3.2 oz (121.7 kg) 05/21/20 : 268 lb 6.4 oz (121.7 kg) 03/07/20 : 280 lb 6.4 oz (127.2 kg)  He has been having ringing in his ears for the last 5 years.  He has not had a carotid doppler done as well.  He also has not been to the tinnitus clinic.      Past Medical History:  Diagnosis Date  . Allergic rhinitis   . Anal fissure   . Anxiety   . Depression   . Family history of adverse reaction to anesthesia    mother - hard awaken  . Gastric polyp 03/2005   Benign  . GERD (gastroesophageal reflux disease)   . H. pylori infection   . Headache(784.0)   . Hyperlipidemia 06/04/2012  . Hypertension   . IBS (irritable bowel syndrome)   . Insomnia   . Plantar fasciitis   . PONV (postoperative nausea and vomiting)   . Pre-diabetes      Family History  Problem Relation Age of Onset  . Hypertension Mother   .  Diabetes Mother      Current Outpatient Medications:  .  ALPRAZolam (XANAX) 1 MG tablet, Take 1 tablet (1 mg total) by mouth at bedtime as needed for anxiety or sleep., Disp: 30 tablet, Rfl: 3 .  atorvastatin (LIPITOR) 20 MG tablet, TAKE 1 TABLET BY MOUTH  DAILY, Disp: 90 tablet, Rfl: 0 .  lisinopril-hydrochlorothiazide (ZESTORETIC) 20-25 MG tablet, TAKE 1 TABLET BY MOUTH  DAILY, Disp: 90 tablet, Rfl: 1 .  omeprazole (PRILOSEC) 20 MG capsule, TAKE 1 CAPSULE BY MOUTH  DAILY, Disp: 90 capsule, Rfl: 3   Allergies  Allergen Reactions  . Hydrocodone     Headache, hypersensitivity     Review of Systems  Constitutional: Negative.   HENT: Negative.   Eyes: Negative.   Respiratory: Negative.   Cardiovascular: Negative.  Negative for chest pain, palpitations and leg swelling.  Gastrointestinal: Negative.   Endocrine: Negative.   Genitourinary: Negative.   Musculoskeletal: Negative.   Skin: Negative.   Neurological: Negative.        During high school he suffered a concussion and has concerns about his memory. Reports information does not stay in his memory bank.   Psychiatric/Behavioral: Negative.      Today's Vitals   08/22/20 0833  BP: 116/80  Pulse: 70  Temp: 98 F (  36.7 C)  TempSrc: Oral  Weight: 268 lb 3.2 oz (121.7 kg)  Height: 5' 9.2" (1.758 m)  PainSc: 0-No pain   Body mass index is 39.38 kg/m.   Objective:  Physical Exam Vitals reviewed.  Constitutional:      General: He is not in acute distress.    Appearance: Normal appearance. He is obese.  HENT:     Right Ear: Hearing normal.     Left Ear: Hearing normal.  Eyes:     General: Lids are normal.     Conjunctiva/sclera: Conjunctivae normal.     Pupils: Pupils are equal, round, and reactive to light.  Cardiovascular:     Rate and Rhythm: Normal rate and regular rhythm.     Pulses: Normal pulses.     Heart sounds: Normal heart sounds. No murmur heard.   Pulmonary:     Effort: Pulmonary effort is normal.  No respiratory distress.     Breath sounds: Normal breath sounds. No wheezing.  Musculoskeletal:        General: No swelling.  Neurological:     General: No focal deficit present.     Mental Status: He is alert and oriented to person, place, and time.     Cranial Nerves: No cranial nerve deficit.  Psychiatric:        Mood and Affect: Mood normal.        Behavior: Behavior normal.        Thought Content: Thought content normal.        Judgment: Judgment normal.         Assessment And Plan:     1. Prediabetes  Chronic, still at 6.2  He stopped taking his metformin on his own, discussed the importance  current medications  Encouraged to limit intake of sugary foods and drinks  Encouraged to increase physical activity to 150 minutes per week - Hemoglobin A1c - BMP8+eGFR  2. Memory changes  Memory test is normal  Will check for metabolic causes and check a CT scan due to previous traumatic head injury  - Vitamin B12 - TSH - CT Head Wo Contrast; Future  3. History of concussion - CT Head Wo Contrast; Future  4. Influenza vaccination declined  Patient declined influenza vaccination at this time. Patient is aware that influenza vaccine prevents illness in 70% of healthy people, and reduces hospitalizations to 30-70% in elderly. This vaccine is recommended annually. Pt is willing to accept risk associated with refusing vaccination.     Patient was given opportunity to ask questions. Patient verbalized understanding of the plan and was able to repeat key elements of the plan. All questions were answered to their satisfaction.  [name], FNP   I, [name], FNP, have reviewed all documentation for this visit. The documentation on 08/22/20 for the exam, diagnosis, procedures, and orders are all accurate and complete.  THE PATIENT IS ENCOURAGED TO PRACTICE SOCIAL DISTANCING DUE TO THE COVID-19 PANDEMIC.

## 2020-08-22 NOTE — Patient Instructions (Addendum)
Prediabetes Prediabetes is the condition of having a blood sugar (blood glucose) level that is higher than it should be, but not high enough for you to be diagnosed with type 2 diabetes. Having prediabetes puts you at risk for developing type 2 diabetes (type 2 diabetes mellitus). Prediabetes may be called impaired glucose tolerance or impaired fasting glucose. Prediabetes usually does not cause symptoms. Your health care provider can diagnose this condition with blood tests. You may be tested for prediabetes if you are overweight and if you have at least one other risk factor for prediabetes. What is blood glucose, and how is it measured? Blood glucose refers to the amount of glucose in your bloodstream. Glucose comes from eating foods that contain sugars and starches (carbohydrates), which the body breaks down into glucose. Your blood glucose level may be measured in mg/dL (milligrams per deciliter) or mmol/L (millimoles per liter). Your blood glucose may be checked with one or more of the following blood tests:  A fasting blood glucose (FBG) test. You will not be allowed to eat (you will fast) for 8 hours or longer before a blood sample is taken. ? A normal range for FBG is 70-100 mg/dl (3.9-5.6 mmol/L).  An A1c (hemoglobin A1c) blood test. This test provides information about blood glucose control over the previous 2?3months.  An oral glucose tolerance test (OGTT). This test measures your blood glucose at two times: ? After fasting. This is your baseline level. ? Two hours after you drink a beverage that contains glucose. You may be diagnosed with prediabetes:  If your FBG is 100?125 mg/dL (5.6-6.9 mmol/L).  If your A1c level is 5.7?6.4%.  If your OGTT result is 140?199 mg/dL (7.8-11 mmol/L). These blood tests may be repeated to confirm your diagnosis. How can this condition affect me? The pancreas produces a hormone (insulin) that helps to move glucose from the bloodstream into cells.  When cells in the body do not respond properly to insulin that the body makes (insulin resistance), excess glucose builds up in the blood instead of going into cells. As a result, high blood glucose (hyperglycemia) can develop, which can cause many complications. Hyperglycemia is a symptom of prediabetes. Having high blood glucose for a long time is dangerous. Too much glucose in your blood can damage your nerves and blood vessels. Long-term damage can lead to complications from diabetes, which may include:  Heart disease.  Stroke.  Blindness.  Kidney disease.  Depression.  Poor circulation in the feet and legs, which could lead to surgical removal (amputation) in severe cases. What can increase my risk? Risk factors for prediabetes include:  Having a family member with type 2 diabetes.  Being overweight or obese.  Being older than age 45.  Being of American Indian, African-American, Hispanic/Latino, or Asian/Pacific Islander descent.  Having an inactive (sedentary) lifestyle.  Having a history of heart disease.  History of gestational diabetes or polycystic ovary syndrome (PCOS), in women.  Having low levels of good cholesterol (HDL-C) or high levels of blood fats (triglycerides).  Having high blood pressure. What actions can I take to prevent diabetes?      Be physically active. ? Do moderate-intensity physical activity for 30 or more minutes on 5 or more days of the week, or as much as told by your health care provider. This could be brisk walking, biking, or water aerobics. ? Ask your health care provider what activities are safe for you. A mix of physical activities may be best, such as   walking, swimming, cycling, and strength training.  Lose weight as told by your health care provider. ? Losing 5-7% of your body weight can reverse insulin resistance. ? Your health care provider can determine how much weight loss is best for you and can help you lose weight  safely.  Follow a healthy meal plan. This includes eating lean proteins, complex carbohydrates, fresh fruits and vegetables, low-fat dairy products, and healthy fats. ? Follow instructions from your health care provider about eating or drinking restrictions. ? Make an appointment to see a diet and nutrition specialist (registered dietitian) to help you create a healthy eating plan that is right for you.  Do not smoke or use any tobacco products, such as cigarettes, chewing tobacco, and e-cigarettes. If you need help quitting, ask your health care provider.  Take over-the-counter and prescription medicines as told by your health care provider. You may be prescribed medicines that help lower the risk of type 2 diabetes.  Keep all follow-up visits as told by your health care provider. This is important. Summary  Prediabetes is the condition of having a blood sugar (blood glucose) level that is higher than it should be, but not high enough for you to be diagnosed with type 2 diabetes.  Having prediabetes puts you at risk for developing type 2 diabetes (type 2 diabetes mellitus).  To help prevent type 2 diabetes, make lifestyle changes such as being physically active and eating a healthy diet. Lose weight as told by your health care provider. This information is not intended to replace advice given to you by your health care provider. Make sure you discuss any questions you have with your health care provider. Document Revised: 12/17/2018 Document Reviewed: 10/16/2015 Elsevier Patient Education  2020 [facility] in [place] - we may need to refer you here to do an evaluation to see what other options you may have for the ringing in your ear.

## 2020-08-23 LAB — BMP8+EGFR
BUN/Creatinine Ratio: 15 (ref 9–20)
BUN: 14 mg/dL (ref 6–24)
CO2: 23 mmol/L (ref 20–29)
Calcium: 10.1 mg/dL (ref 8.7–10.2)
Chloride: 96 mmol/L (ref 96–106)
Creatinine, Ser: 0.96 mg/dL (ref 0.76–1.27)
GFR calc Af Amer: 106 mL/min/{1.73_m2} (ref >59)
GFR calc non Af Amer: 92 mL/min/{1.73_m2} (ref >59)
Glucose: 112 mg/dL — ABNORMAL HIGH (ref 65–99)
Potassium: 4.1 mmol/L (ref 3.5–5.2)
Sodium: 139 mmol/L (ref 134–144)

## 2020-08-23 LAB — TSH: TSH: 0.966 u[IU]/mL (ref 0.450–4.500)

## 2020-08-23 LAB — HEMOGLOBIN A1C
Est. average glucose Bld gHb Est-mCnc: 131 mg/dL
Hgb A1c MFr Bld: 6.2 % — ABNORMAL HIGH (ref 4.8–5.6)

## 2020-08-23 LAB — VITAMIN B12: Vitamin B-12: 1208 pg/mL (ref 232–1245)

## 2020-08-30 ENCOUNTER — Telehealth: Payer: Self-pay

## 2020-08-30 NOTE — Telephone Encounter (Signed)
Patient notified of lab results. YL,RMA 

## 2020-09-12 ENCOUNTER — Telehealth: Payer: Self-pay | Admitting: Nurse Practitioner

## 2020-09-12 NOTE — Telephone Encounter (Signed)
Called [facility] for peer to peer for his CT scan of brain, this has been changed to an MRI brain without contrast. This was approved with an approval number [id number].  Will make referral specialist aware.

## 2020-09-17 ENCOUNTER — Other Ambulatory Visit: Payer: Self-pay | Admitting: Nurse Practitioner

## 2020-09-17 ENCOUNTER — Ambulatory Visit
Admission: RE | Admit: 2020-09-17 | Discharge: 2020-09-17 | Disposition: A | Discharge status: Home / Self Care | Payer: 59 | Source: Ambulatory Visit | Attending: Nurse Practitioner | Admitting: Nurse Practitioner

## 2020-09-17 ENCOUNTER — Other Ambulatory Visit: Payer: 59

## 2020-09-17 ENCOUNTER — Other Ambulatory Visit: Payer: Self-pay

## 2020-09-17 DIAGNOSIS — Z87820 Personal history of traumatic brain injury: Secondary | ICD-10-CM

## 2020-09-17 DIAGNOSIS — R413 Other amnesia: Secondary | ICD-10-CM

## 2020-09-18 ENCOUNTER — Other Ambulatory Visit: Payer: 59

## 2020-09-20 ENCOUNTER — Other Ambulatory Visit: Payer: Self-pay | Admitting: Nurse Practitioner

## 2020-09-20 DIAGNOSIS — H9313 Tinnitus, bilateral: Secondary | ICD-10-CM

## 2020-09-20 NOTE — Telephone Encounter (Signed)
Please refill patient's prescription YL,RMA 

## 2020-10-24 ENCOUNTER — Other Ambulatory Visit: Payer: Self-pay | Admitting: Nurse Practitioner

## 2020-12-13 ENCOUNTER — Other Ambulatory Visit: Payer: Self-pay | Admitting: Nurse Practitioner

## 2020-12-13 DIAGNOSIS — J310 Chronic rhinitis: Secondary | ICD-10-CM

## 2020-12-13 DIAGNOSIS — H833X2 Noise effects on left inner ear: Secondary | ICD-10-CM | POA: Insufficient documentation

## 2020-12-13 HISTORY — DX: Chronic rhinitis: J31.0

## 2020-12-27 ENCOUNTER — Encounter: Payer: Self-pay | Admitting: Nurse Practitioner

## 2020-12-27 ENCOUNTER — Other Ambulatory Visit: Payer: Self-pay

## 2020-12-27 ENCOUNTER — Ambulatory Visit: Payer: 59 | Admitting: Nurse Practitioner

## 2020-12-27 VITALS — BP 118/72 | HR 75 | Temp 97.8°F | Ht 69.2 in | Wt 267.0 lb

## 2020-12-27 DIAGNOSIS — E782 Mixed hyperlipidemia: Secondary | ICD-10-CM

## 2020-12-27 DIAGNOSIS — R7303 Prediabetes: Secondary | ICD-10-CM

## 2020-12-27 DIAGNOSIS — I10 Essential (primary) hypertension: Secondary | ICD-10-CM

## 2020-12-27 DIAGNOSIS — G4701 Insomnia due to medical condition: Secondary | ICD-10-CM

## 2020-12-27 DIAGNOSIS — Z6839 Body mass index (BMI) 39.0-39.9, adult: Secondary | ICD-10-CM

## 2020-12-27 MED ORDER — OZEMPIC (0.25 OR 0.5 MG/DOSE) 2 MG/1.5ML ~~LOC~~ SOPN: 0.5000 mg | PEN_INJECTOR | SUBCUTANEOUS | 1 refills | Status: DC

## 2020-12-27 NOTE — Progress Notes (Signed)
I,Erica T Jackson,acting as a scribe for Janece Moore, FNP.,have documented all relevant documentation on the behalf of Janece Moore, FNP,as directed by  Janece Moore, FNP while in the presence of Janece Moore, FNP. This visit occurred during the SARS-CoV-2 public health emergency.  Safety protocols were in place, including screening questions prior to the visit, additional usage of staff PPE, and extensive cleaning of exam room while observing appropriate contact time as indicated for disinfecting solutions.  Subjective:     Patient ID: [name] , male    DOB: 05/22/1971 , 50 y.o.   MRN: [id number]   Chief Complaint  Patient presents with  . Prediabetes    HPI  Patient is here for a f/u on prediabetes. He has started back taking his metformin the extended release. He has been checking his blood sugar was up to 158 and started back on metformin at the beginning of April.  He is more concerned about side effects of metformin.  He continues to do apple cider vinegar.  He is exercising currently with walking 10,000 steps a day 5 days a week, had fallen off due to the passing of his brother in December - he had a brain aneurysm.  Mother passed June 2021 from ESRD.  He had been eating more carbs and rice. Now he is eating better with grilled salmon and chicken.  He has been to a diabetic nutritionist.   He has been using [facility] for therapy after the death of his brother and mother. He has been trying ways to decrease the ringing in his ears.  Wt Readings from Last 3 Encounters: 12/27/20 : 267 lb (121.1 kg) 08/22/20 : 268 lb 3.2 oz (121.7 kg) 05/21/20 : 268 lb 6.4 oz (121.7 kg)    Past Medical History:  Diagnosis Date  . Allergic rhinitis   . Anal fissure   . Anxiety   . Depression   . Family history of adverse reaction to anesthesia    mother - hard awaken  . Gastric polyp 03/2005   Benign  . GERD (gastroesophageal reflux disease)   . H. pylori infection   . Headache(784.0)    . Hyperlipidemia 06/04/2012  . Hypertension   . IBS (irritable bowel syndrome)   . Insomnia   . Plantar fasciitis   . PONV (postoperative nausea and vomiting)   . Pre-diabetes      Family History  Problem Relation Age of Onset  . Hypertension Mother   . Diabetes Mother      Current Outpatient Medications:  .  atorvastatin (LIPITOR) 20 MG tablet, TAKE 1 TABLET BY MOUTH  DAILY, Disp: 90 tablet, Rfl: 3 .  lisinopril-hydrochlorothiazide (ZESTORETIC) 20-25 MG tablet, TAKE 1 TABLET BY MOUTH  DAILY, Disp: 90 tablet, Rfl: 3 .  omeprazole (PRILOSEC) 20 MG capsule, TAKE 1 CAPSULE BY MOUTH  DAILY, Disp: 90 capsule, Rfl: 3 .  Semaglutide,0.25 or 0.5MG/DOS, (OZEMPIC, 0.25 OR 0.5 MG/DOSE,) 2 MG/1.5ML SOPN, Inject 0.5 mg into the skin once a week., Disp: 4.5 mL, Rfl: 1 .  ALPRAZolam (XANAX) 1 MG tablet, TAKE ONE TABLET BY MOUTH EVERY NIGHT AT BEDTIME AS NEEDED FOR ANXIETY OR SLEEP, Disp: 30 tablet, Rfl: 2 .  metFORMIN (GLUCOPHAGE-XR) 500 MG 24 hr tablet, TAKE ONE TABLET BY MOUTH TWICE A DAY, Disp: 60 tablet, Rfl: 1   Allergies  Allergen Reactions  . Hydrocodone     Headache, hypersensitivity     Review of Systems  Constitutional: Negative.  Negative for fatigue.    HENT: Negative.   Eyes: Negative.   Respiratory: Negative.  Negative for shortness of breath and wheezing.   Cardiovascular: Negative.  Negative for chest pain, palpitations and leg swelling.  Gastrointestinal: Negative.   Endocrine: Negative.  Negative for polydipsia, polyphagia and polyuria.  Genitourinary: Negative.   Musculoskeletal: Negative.   Skin: Negative.   Neurological: Negative.  Negative for dizziness and headaches.  Psychiatric/Behavioral: Positive for sleep disturbance (melatonin occasionally helps).     Today's Vitals   12/27/20 0836  BP: 118/72  Pulse: 75  Temp: 97.8 F (36.6 C)  TempSrc: Oral  Weight: 267 lb (121.1 kg)  Height: 5' 9.2" (1.758 m)   Body mass index is 39.2 kg/m.  Wt Readings from  Last 3 Encounters:  12/27/20 267 lb (121.1 kg)  08/22/20 268 lb 3.2 oz (121.7 kg)  05/21/20 268 lb 6.4 oz (121.7 kg)   Objective:  Physical Exam Vitals reviewed.  Constitutional:      General: He is not in acute distress.    Appearance: Normal appearance. He is obese.  HENT:     Right Ear: Hearing normal.     Left Ear: Hearing normal.  Eyes:     General: Lids are normal.     Conjunctiva/sclera: Conjunctivae normal.     Pupils: Pupils are equal, round, and reactive to light.  Cardiovascular:     Rate and Rhythm: Normal rate and regular rhythm.     Pulses: Normal pulses.     Heart sounds: Normal heart sounds. No murmur heard.   Pulmonary:     Effort: Pulmonary effort is normal. No respiratory distress.     Breath sounds: Normal breath sounds. No wheezing.  Musculoskeletal:        General: No swelling.  Neurological:     General: No focal deficit present.     Mental Status: He is alert and oriented to person, place, and time.     Cranial Nerves: No cranial nerve deficit.     Motor: No weakness.  Psychiatric:        Mood and Affect: Mood normal.        Behavior: Behavior normal.        Thought Content: Thought content normal.        Judgment: Judgment normal.         Assessment And Plan:     1. Prediabetes  Chronic, he had stopped the metformin due to concerns about side effects however he has restarted in the last few weeks.  I have given him information about Ozempic to discuss with his wife.   Discussed side effects to include nausea and constipation - Semaglutide,0.25 or 0.5MG/DOS, (OZEMPIC, 0.25 OR 0.5 MG/DOSE,) 2 MG/1.5ML SOPN; Inject 0.5 mg into the skin once a week.  Dispense: 4.5 mL; Refill: 1 - Hemoglobin A1c - CMP14+EGFR  2. Essential hypertension . B/P is well controlled.  . CMP ordered to check renal function.  . The importance of regular exercise and dietary modification was stressed to the patient.  . Stressed importance of losing ten percent of  her body weight to help with B/P control.  - CMP14+EGFR  3. Mixed hyperlipidemia  Chronic, stable  Continue with current medications, tolerating well - Lipid panel  4. Insomnia due to medical condition  He has been taking alprazolam for his tinnitus that keeps him awake  5. BMI 39.0-39.9,adult  He is encouraged to initially strive for BMI less than 30 to decrease cardiac risk. He is advised to exercise no  less than 150 minutes per week.    Patient was given opportunity to ask questions. Patient verbalized understanding of the plan and was able to repeat key elements of the plan. All questions were answered to their satisfaction.  [name], FNP   I, [name], FNP, have reviewed all documentation for this visit. The documentation on 12/27/20 for the exam, diagnosis, procedures, and orders are all accurate and complete.   IF YOU HAVE BEEN REFERRED TO A SPECIALIST, IT MAY TAKE 1-2 WEEKS TO SCHEDULE/PROCESS THE REFERRAL. IF YOU HAVE NOT HEARD FROM US/SPECIALIST IN TWO WEEKS, PLEASE GIVE [place] A CALL AT [phone] X [phone].   THE PATIENT IS ENCOURAGED TO PRACTICE SOCIAL DISTANCING DUE TO THE COVID-19 PANDEMIC.

## 2020-12-27 NOTE — Patient Instructions (Addendum)

## 2020-12-28 ENCOUNTER — Other Ambulatory Visit: Payer: Self-pay | Admitting: Nurse Practitioner

## 2020-12-28 DIAGNOSIS — H9313 Tinnitus, bilateral: Secondary | ICD-10-CM

## 2020-12-28 LAB — HEMOGLOBIN A1C
Est. average glucose Bld gHb Est-mCnc: 140 mg/dL
Hgb A1c MFr Bld: 6.5 % — ABNORMAL HIGH (ref 4.8–5.6)

## 2020-12-28 LAB — CMP14+EGFR
ALT: 35 IU/L (ref 0–44)
AST: 20 IU/L (ref 0–40)
Albumin/Globulin Ratio: 1.8 (ref 1.2–2.2)
Albumin: 4.9 g/dL (ref 3.8–4.9)
Alkaline Phosphatase: 55 IU/L (ref 44–121)
BUN/Creatinine Ratio: 17 (ref 9–20)
BUN: 15 mg/dL (ref 6–24)
Bilirubin Total: 0.4 mg/dL (ref 0.0–1.2)
CO2: 24 mmol/L (ref 20–29)
Calcium: 9.9 mg/dL (ref 8.7–10.2)
Chloride: 98 mmol/L (ref 96–106)
Creatinine, Ser: 0.89 mg/dL (ref 0.76–1.27)
Globulin, Total: 2.8 g/dL (ref 1.5–4.5)
Glucose: 100 mg/dL — ABNORMAL HIGH (ref 65–99)
Potassium: 4.1 mmol/L (ref 3.5–5.2)
Sodium: 140 mmol/L (ref 134–144)
Total Protein: 7.7 g/dL (ref 6.0–8.5)
eGFR: 104 mL/min/{1.73_m2} (ref >59)

## 2020-12-28 LAB — LIPID PANEL
Chol/HDL Ratio: 3.6 ratio (ref 0.0–5.0)
Cholesterol, Total: 116 mg/dL (ref 100–199)
HDL: 32 mg/dL — ABNORMAL LOW (ref >39)
LDL Chol Calc (NIH): 67 mg/dL (ref 0–99)
Triglycerides: 83 mg/dL (ref 0–149)
VLDL Cholesterol Cal: 17 mg/dL (ref 5–40)

## 2020-12-28 NOTE — Telephone Encounter (Signed)
Alprazolam refill 

## 2020-12-31 ENCOUNTER — Other Ambulatory Visit: Payer: Self-pay | Admitting: Nurse Practitioner

## 2020-12-31 ENCOUNTER — Encounter: Payer: Self-pay | Admitting: Nurse Practitioner

## 2020-12-31 ENCOUNTER — Other Ambulatory Visit: Payer: Self-pay

## 2020-12-31 DIAGNOSIS — R7303 Prediabetes: Secondary | ICD-10-CM

## 2020-12-31 MED ORDER — METFORMIN HCL ER 500 MG PO TB24: 500.0000 mg | ORAL_TABLET | Freq: Two times a day (BID) | ORAL | 1 refills | Status: DC

## 2021-01-01 ENCOUNTER — Other Ambulatory Visit: Payer: Self-pay | Admitting: Nurse Practitioner

## 2021-04-01 ENCOUNTER — Other Ambulatory Visit: Payer: Self-pay | Admitting: Nurse Practitioner

## 2021-04-01 DIAGNOSIS — H9313 Tinnitus, bilateral: Secondary | ICD-10-CM

## 2021-04-21 ENCOUNTER — Other Ambulatory Visit: Payer: Self-pay | Admitting: Nurse Practitioner

## 2021-04-21 DIAGNOSIS — R7303 Prediabetes: Secondary | ICD-10-CM

## 2021-05-08 ENCOUNTER — Other Ambulatory Visit: Payer: Self-pay

## 2021-05-08 DIAGNOSIS — R7303 Prediabetes: Secondary | ICD-10-CM

## 2021-05-08 MED ORDER — OZEMPIC (0.25 OR 0.5 MG/DOSE) 2 MG/1.5ML ~~LOC~~ SOPN: PEN_INJECTOR | SUBCUTANEOUS | 1 refills | Status: DC

## 2021-05-09 ENCOUNTER — Other Ambulatory Visit: Payer: Self-pay | Admitting: Nurse Practitioner

## 2021-05-24 ENCOUNTER — Other Ambulatory Visit: Payer: Self-pay | Admitting: Nurse Practitioner

## 2021-05-28 ENCOUNTER — Ambulatory Visit: Payer: 59 | Admitting: Nurse Practitioner

## 2021-06-02 ENCOUNTER — Other Ambulatory Visit: Payer: Self-pay | Admitting: Nurse Practitioner

## 2021-06-02 DIAGNOSIS — H9313 Tinnitus, bilateral: Secondary | ICD-10-CM

## 2021-06-18 ENCOUNTER — Other Ambulatory Visit: Payer: Self-pay

## 2021-06-18 DIAGNOSIS — R7303 Prediabetes: Secondary | ICD-10-CM

## 2021-06-18 MED ORDER — ACCU-CHEK GUIDE VI STRP: ORAL_STRIP | 2 refills | Status: DC

## 2021-06-18 MED ORDER — ACCU-CHEK GUIDE W/DEVICE KIT: PACK | 1 refills | Status: AC

## 2021-06-18 MED ORDER — ACCU-CHEK SOFTCLIX LANCETS MISC: 12 refills | Status: AC

## 2021-06-20 ENCOUNTER — Ambulatory Visit: Payer: 59 | Admitting: Nurse Practitioner

## 2021-06-20 ENCOUNTER — Encounter: Payer: Self-pay | Admitting: Nurse Practitioner

## 2021-06-20 ENCOUNTER — Other Ambulatory Visit: Payer: Self-pay

## 2021-06-20 VITALS — BP 122/80 | HR 86 | Temp 98.1°F | Ht 69.2 in | Wt 249.0 lb

## 2021-06-20 DIAGNOSIS — R7303 Prediabetes: Secondary | ICD-10-CM | POA: Diagnosis not present

## 2021-06-20 DIAGNOSIS — E782 Mixed hyperlipidemia: Secondary | ICD-10-CM | POA: Diagnosis not present

## 2021-06-20 DIAGNOSIS — E6609 Other obesity due to excess calories: Secondary | ICD-10-CM

## 2021-06-20 DIAGNOSIS — I10 Essential (primary) hypertension: Secondary | ICD-10-CM

## 2021-06-20 DIAGNOSIS — F411 Generalized anxiety disorder: Secondary | ICD-10-CM

## 2021-06-20 DIAGNOSIS — Z6836 Body mass index (BMI) 36.0-36.9, adult: Secondary | ICD-10-CM

## 2021-06-20 DIAGNOSIS — Z2821 Immunization not carried out because of patient refusal: Secondary | ICD-10-CM

## 2021-06-20 DIAGNOSIS — Z23 Encounter for immunization: Secondary | ICD-10-CM

## 2021-06-20 MED ORDER — OZEMPIC (1 MG/DOSE) 4 MG/3ML ~~LOC~~ SOPN: 1.0000 mg | PEN_INJECTOR | SUBCUTANEOUS | 1 refills | Status: DC

## 2021-06-20 NOTE — Progress Notes (Signed)
Rich Brave [place],acting as a Education administrator for [name], FNP.,have documented all relevant documentation on the behalf of [name], FNP,as directed by  [name], FNP while in the presence of [name], [place].  This visit occurred during the SARS-CoV-2 public health emergency.  Safety protocols were in place, including screening questions prior to the visit, additional usage of staff PPE, and extensive cleaning of exam room while observing appropriate contact time as indicated for disinfecting solutions.  Subjective:     Patient ID: [name] , male    DOB: 1970/11/18 , 50 y.o.   MRN: [id number]   Chief Complaint  Patient presents with   prediabetes f/u     HPI  Patient is here for a f/u on prediabetes. Continues to take Ozempic 0.5 mg tolerating well and metformin 500 mg XR.  He can tell a difference in his appetite, he is having to find different snacks to help. He is drinking more water and noticed having to urinate.   Wt Readings from Last 3 Encounters: 06/20/21 : 249 lb (112.9 kg) 12/27/20 : 267 lb (121.1 kg) 08/22/20 : 268 lb 3.2 oz (121.7 kg)    Diabetes He presents for his follow-up diabetic visit. Diabetes type: prediabetes. There are no hypoglycemic associated symptoms. Pertinent negatives for hypoglycemia include no dizziness or headaches. Pertinent negatives for diabetes include no blurred vision. There are no hypoglycemic complications. There are no diabetic complications. Risk factors for coronary artery disease include obesity and male sex. Current diabetic treatment includes oral agent (dual therapy). (Blood sugar is 107-146)    Past Medical History:  Diagnosis Date   Allergic rhinitis    Anal fissure    Anxiety    Depression    Family history of adverse reaction to anesthesia    mother - hard awaken   Gastric polyp 03/2005   Benign   GERD (gastroesophageal reflux disease)    H. pylori infection    Headache(784.0)    Hyperlipidemia 06/04/2012    Hypertension    IBS (irritable bowel syndrome)    Insomnia    Plantar fasciitis    PONV (postoperative nausea and vomiting)    Pre-diabetes      Family History  Problem Relation Age of Onset   Hypertension Mother    Diabetes Mother      Current Outpatient Medications:    Accu-Chek Softclix Lancets lancets, Use to check blood sugars once daily R73.03, Disp: 100 each, Rfl: 12   ALPRAZolam (XANAX) 1 MG tablet, TAKE ONE TABLET BY MOUTH EVERY NIGHT AT BEDTIME AS NEEDED FOR ANXIETY/SLEEP, Disp: 30 tablet, Rfl: 2   atorvastatin (LIPITOR) 20 MG tablet, TAKE 1 TABLET BY MOUTH  DAILY, Disp: 90 tablet, Rfl: 3   Blood Glucose Monitoring Suppl (ACCU-CHEK GUIDE) w/Device KIT, Check blood sugars once daily R73.03, Disp: 1 kit, Rfl: 1   glucose blood (ACCU-CHEK GUIDE) test strip, Use to check blood sugars once daily R73.03, Disp: 100 each, Rfl: 2   lisinopril-hydrochlorothiazide (ZESTORETIC) 20-25 MG tablet, TAKE 1 TABLET BY MOUTH  DAILY, Disp: 90 tablet, Rfl: 3   metFORMIN (GLUCOPHAGE-XR) 500 MG 24 hr tablet, TAKE 1 TABLET BY MOUTH  TWICE DAILY, Disp: 180 tablet, Rfl: 3   omeprazole (PRILOSEC) 20 MG capsule, TAKE 1 CAPSULE BY MOUTH  DAILY, Disp: 90 capsule, Rfl: 3   Semaglutide, 1 MG/DOSE, (OZEMPIC, 1 MG/DOSE,) 4 MG/3ML SOPN, Inject 1 mg into the skin once a week., Disp: 9 mL, Rfl: 1   Allergies  Allergen Reactions  Hydrocodone     Headache, hypersensitivity     Review of Systems  Constitutional: Negative.   Eyes: Negative.  Negative for blurred vision.  Respiratory: Negative.    Cardiovascular: Negative.   Musculoskeletal: Negative.   Skin: Negative.   Neurological:  Negative for dizziness and headaches.  Hematological: Negative.   Psychiatric/Behavioral: Negative.      Today's Vitals   06/20/21 0855  BP: 122/80  Pulse: 86  Temp: 98.1 F (36.7 C)  Weight: 249 lb (112.9 kg)  Height: 5' 9.2" (1.758 m)  PainSc: 0-No pain   Body mass index is 36.56 kg/m.   Objective:  Physical  Exam Vitals reviewed.  Constitutional:      Appearance: Normal appearance.  Cardiovascular:     Rate and Rhythm: Normal rate and regular rhythm.     Pulses: Normal pulses.     Heart sounds: Normal heart sounds. No murmur heard. Pulmonary:     Effort: Pulmonary effort is normal. No respiratory distress.     Breath sounds: Normal breath sounds. No wheezing.  Neurological:     Mental Status: He is alert.        Assessment And Plan:     1. Prediabetes Comments: HgbA1c slightly worse at last visit. Continue current medications - Hemoglobin A1c - BMP8+eGFR  2. Essential hypertension Comments: Blood pressure is well controlled  Continue low salt diet and regular exercise.   3. Mixed hyperlipidemia Comments: Stable, continue current low fat diet - Lipid panel  4. Anxiety state Comments: Stable, take alprazolam as needed  5. Class 2 obesity due to excess calories with body mass index (BMI) of 36.0 to 36.9 in adult, unspecified whether serious comorbidity present Comments: Congratulated on his weight loss, encouraged to continue following a healthy diet and regular exercise.  6. Encounter for immunization  7. Influenza vaccination declined Patient declined influenza vaccination at this time. Patient is aware that influenza vaccine prevents illness in 70% of healthy people, and reduces hospitalizations to 30-70% in elderly. This vaccine is recommended annually. Pt is willing to accept risk associated with refusing vaccination.    Patient was given opportunity to ask questions. Patient verbalized understanding of the plan and was able to repeat key elements of the plan. All questions were answered to their satisfaction.  [name], FNP   I, [name], FNP, have reviewed all documentation for this visit. The documentation on 07/04/21 for the exam, diagnosis, procedures, and orders are all accurate and complete.   IF YOU HAVE BEEN REFERRED TO A SPECIALIST, IT MAY TAKE 1-2  WEEKS TO SCHEDULE/PROCESS THE REFERRAL. IF YOU HAVE NOT HEARD FROM US/SPECIALIST IN TWO WEEKS, PLEASE GIVE [place] A CALL AT [phone].   THE PATIENT IS ENCOURAGED TO PRACTICE SOCIAL DISTANCING DUE TO THE COVID-19 PANDEMIC.

## 2021-06-21 LAB — BMP8+EGFR
BUN/Creatinine Ratio: 26 — ABNORMAL HIGH (ref 9–20)
BUN: 23 mg/dL (ref 6–24)
CO2: 22 mmol/L (ref 20–29)
Calcium: 9.7 mg/dL (ref 8.7–10.2)
Chloride: 97 mmol/L (ref 96–106)
Creatinine, Ser: 0.88 mg/dL (ref 0.76–1.27)
Glucose: 91 mg/dL (ref 70–99)
Potassium: 4 mmol/L (ref 3.5–5.2)
Sodium: 139 mmol/L (ref 134–144)
eGFR: 104 mL/min/{1.73_m2} (ref >59)

## 2021-06-21 LAB — LIPID PANEL
Chol/HDL Ratio: 3 ratio (ref 0.0–5.0)
Cholesterol, Total: 106 mg/dL (ref 100–199)
HDL: 35 mg/dL — ABNORMAL LOW (ref >39)
LDL Chol Calc (NIH): 59 mg/dL (ref 0–99)
Triglycerides: 51 mg/dL (ref 0–149)
VLDL Cholesterol Cal: 12 mg/dL (ref 5–40)

## 2021-06-21 LAB — HEMOGLOBIN A1C
Est. average glucose Bld gHb Est-mCnc: 117 mg/dL
Hgb A1c MFr Bld: 5.7 % — ABNORMAL HIGH (ref 4.8–5.6)

## 2021-07-01 ENCOUNTER — Other Ambulatory Visit: Payer: Self-pay | Admitting: Nurse Practitioner

## 2021-07-01 DIAGNOSIS — Z6836 Body mass index (BMI) 36.0-36.9, adult: Secondary | ICD-10-CM

## 2021-07-01 DIAGNOSIS — R7303 Prediabetes: Secondary | ICD-10-CM

## 2021-07-01 DIAGNOSIS — E6609 Other obesity due to excess calories: Secondary | ICD-10-CM

## 2021-07-02 ENCOUNTER — Other Ambulatory Visit: Payer: Self-pay

## 2021-07-02 DIAGNOSIS — E6609 Other obesity due to excess calories: Secondary | ICD-10-CM

## 2021-07-02 DIAGNOSIS — R7303 Prediabetes: Secondary | ICD-10-CM

## 2021-07-02 MED ORDER — OZEMPIC (1 MG/DOSE) 4 MG/3ML ~~LOC~~ SOPN: 1.0000 mg | PEN_INJECTOR | SUBCUTANEOUS | 1 refills | Status: DC

## 2021-07-30 ENCOUNTER — Other Ambulatory Visit: Payer: Self-pay | Admitting: Nurse Practitioner

## 2021-07-30 DIAGNOSIS — H9313 Tinnitus, bilateral: Secondary | ICD-10-CM

## 2021-07-30 MED ORDER — ALPRAZOLAM 1 MG PO TABS: ORAL_TABLET | ORAL | 2 refills | Status: DC

## 2021-08-29 ENCOUNTER — Other Ambulatory Visit: Payer: Self-pay

## 2021-08-29 ENCOUNTER — Other Ambulatory Visit: Payer: Self-pay | Admitting: Nurse Practitioner

## 2021-08-29 DIAGNOSIS — H9313 Tinnitus, bilateral: Secondary | ICD-10-CM

## 2021-08-29 MED ORDER — ALPRAZOLAM 1 MG PO TABS: ORAL_TABLET | ORAL | 2 refills | Status: DC

## 2021-09-30 ENCOUNTER — Other Ambulatory Visit: Payer: Self-pay | Admitting: Nurse Practitioner

## 2021-10-01 ENCOUNTER — Ambulatory Visit: Payer: 59 | Admitting: Nurse Practitioner

## 2021-10-01 ENCOUNTER — Other Ambulatory Visit: Payer: Self-pay

## 2021-10-01 ENCOUNTER — Encounter: Payer: Self-pay | Admitting: Nurse Practitioner

## 2021-10-01 VITALS — BP 110/80 | HR 89 | Temp 98.1°F | Ht 69.0 in | Wt 245.8 lb

## 2021-10-01 DIAGNOSIS — R7303 Prediabetes: Secondary | ICD-10-CM

## 2021-10-01 DIAGNOSIS — E6609 Other obesity due to excess calories: Secondary | ICD-10-CM

## 2021-10-01 DIAGNOSIS — E782 Mixed hyperlipidemia: Secondary | ICD-10-CM

## 2021-10-01 DIAGNOSIS — Z6836 Body mass index (BMI) 36.0-36.9, adult: Secondary | ICD-10-CM

## 2021-10-01 DIAGNOSIS — H9313 Tinnitus, bilateral: Secondary | ICD-10-CM

## 2021-10-01 DIAGNOSIS — I10 Essential (primary) hypertension: Secondary | ICD-10-CM

## 2021-10-01 NOTE — Patient Instructions (Signed)

## 2021-10-01 NOTE — Progress Notes (Signed)
I,Victoria T Hamilton,acting as a Education administrator for [name], FNP.,have documented all relevant documentation on the behalf of [name], FNP,as directed by  [name], FNP while in the presence of [name], [place].   This visit occurred during the SARS-CoV-2 public health emergency.  Safety protocols were in place, including screening questions prior to the visit, additional usage of staff PPE, and extensive cleaning of exam room while observing appropriate contact time as indicated for disinfecting solutions.  Subjective:     Patient ID: [name] , male    DOB: 1971-09-05 , 51 y.o.   MRN: [id number]   Chief Complaint  Patient presents with   Diabetes    HPI  Pt here today for DM/ [facility] f/u. Pt reports taking BP med before appointment this morning. He has no questions or concerns at the moment. Continues with metformin and Ozempic.  He is exercising every other day will walk more and tries to get 10,000 steps a day.   Wt Readings from Last 3 Encounters: 10/01/21 : 245 lb 12.8 oz (111.5 kg) 06/20/21 : 249 lb (112.9 kg) 12/27/20 : 267 lb (121.1 kg)    Diabetes He presents for his follow-up diabetic visit. Diabetes type: prediabetes. There are no hypoglycemic associated symptoms. Pertinent negatives for hypoglycemia include no dizziness or headaches. Pertinent negatives for diabetes include no blurred vision. There are no hypoglycemic complications. There are no diabetic complications. Risk factors for coronary artery disease include obesity and male sex. Current diabetic treatment includes oral agent (dual therapy). (Blood sugar is 119 - 134 (when he eats bad))    Past Medical History:  Diagnosis Date   Allergic rhinitis    Anal fissure    Anxiety    Depression    Family history of adverse reaction to anesthesia    mother - hard awaken   Gastric polyp 03/2005   Benign   GERD (gastroesophageal reflux disease)    H. pylori infection    Headache(784.0)    Hyperlipidemia  06/04/2012   Hypertension    IBS (irritable bowel syndrome)    Insomnia    Plantar fasciitis    PONV (postoperative nausea and vomiting)    Pre-diabetes      Family History  Problem Relation Age of Onset   Hypertension Mother    Diabetes Mother      Current Outpatient Medications:    Accu-Chek Softclix Lancets lancets, Use to check blood sugars once daily R73.03, Disp: 100 each, Rfl: 12   ALPRAZolam (XANAX) 1 MG tablet, TAKE ONE TABLET BY MOUTH EVERY NIGHT AT BEDTIME AS NEEDED FOR ANXIETY/SLEEP, Disp: 30 tablet, Rfl: 2   Blood Glucose Monitoring Suppl (ACCU-CHEK GUIDE) w/Device KIT, Check blood sugars once daily R73.03, Disp: 1 kit, Rfl: 1   glucose blood (ACCU-CHEK GUIDE) test strip, Use to check blood sugars once daily R73.03, Disp: 100 each, Rfl: 2   lisinopril-hydrochlorothiazide (ZESTORETIC) 20-25 MG tablet, TAKE 1 TABLET BY MOUTH  DAILY, Disp: 90 tablet, Rfl: 3   metFORMIN (GLUCOPHAGE-XR) 500 MG 24 hr tablet, TAKE 1 TABLET BY MOUTH  TWICE DAILY, Disp: 180 tablet, Rfl: 3   omeprazole (PRILOSEC) 20 MG capsule, TAKE 1 CAPSULE BY MOUTH  DAILY, Disp: 90 capsule, Rfl: 3   Semaglutide, 1 MG/DOSE, (OZEMPIC, 1 MG/DOSE,) 4 MG/3ML SOPN, Inject 1 mg into the skin once a week., Disp: 9 mL, Rfl: 1   atorvastatin (LIPITOR) 20 MG tablet, TAKE 1 TABLET BY MOUTH  DAILY, Disp: 90 tablet, Rfl: 3  Allergies  Allergen Reactions   Hydrocodone     Headache, hypersensitivity     Review of Systems  Constitutional: Negative.   HENT: Negative.    Eyes:  Negative for blurred vision.  Respiratory: Negative.    Cardiovascular: Negative.   Musculoskeletal: Negative.   Skin: Negative.   Allergic/Immunologic: Negative.   Neurological:  Negative for dizziness and headaches.  Hematological: Negative.   Psychiatric/Behavioral: Negative.      Today's Vitals   10/01/21 0832  BP: 110/80  Pulse: 89  Temp: 98.1 F (36.7 C)  Weight: 245 lb 12.8 oz (111.5 kg)  Height: _0  (1.753 m)   Body mass  index is 36.3 kg/m.   Objective:  Physical Exam Vitals reviewed.  Constitutional:      General: He is not in acute distress.    Appearance: Normal appearance.  Cardiovascular:     Rate and Rhythm: Normal rate and regular rhythm.     Pulses: Normal pulses.     Heart sounds: Normal heart sounds. No murmur heard. Pulmonary:     Effort: Pulmonary effort is normal. No respiratory distress.     Breath sounds: Normal breath sounds. No wheezing.  Skin:    General: Skin is warm.     Capillary Refill: Capillary refill takes less than 2 seconds.  Neurological:     General: No focal deficit present.     Mental Status: He is alert and oriented to person, place, and time.     Cranial Nerves: No cranial nerve deficit.     Motor: No weakness.  Psychiatric:        Mood and Affect: Mood normal.        Behavior: Behavior normal.        Thought Content: Thought content normal.        Judgment: Judgment normal.        Assessment And Plan:     1. Prediabetes Comments: HgbA1c is improving and he is tolerating metformin and Ozempic well.  - Hemoglobin A1c  2. Essential hypertension Comments: Blood pressure is well controlled, continue current medications.  - BMP8+eGFR  3. Mixed hyperlipidemia Comments: Continue statin, tolerating well.  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 At some point will need to try to wean   5. Class 2 obesity due to excess calories with body mass index (BMI) of 36.0 to 36.9 in adult, unspecified whether serious comorbidity present Comments: Weight continues to decrease, continue to incorporate physical activity.     Patient was given opportunity to ask questions. Patient verbalized understanding of the plan and was able to repeat key elements of the plan. All questions were answered to their satisfaction.  [name], FNP   I, [name], FNP, have  reviewed all documentation for this visit. The documentation on 10/01/21 for the exam, diagnosis, procedures, and orders are all accurate and complete.   IF YOU HAVE BEEN REFERRED TO A SPECIALIST, IT MAY TAKE 1-2 WEEKS TO SCHEDULE/PROCESS THE REFERRAL. IF YOU HAVE NOT HEARD FROM US/SPECIALIST IN TWO WEEKS, PLEASE GIVE [place] A CALL AT [phone].   THE PATIENT IS ENCOURAGED TO PRACTICE SOCIAL DISTANCING DUE TO THE COVID-19 PANDEMIC.

## 2021-10-02 LAB — BMP8+EGFR
BUN/Creatinine Ratio: 22 — ABNORMAL HIGH (ref 9–20)
BUN: 17 mg/dL (ref 6–24)
CO2: 24 mmol/L (ref 20–29)
Calcium: 9.5 mg/dL (ref 8.7–10.2)
Chloride: 99 mmol/L (ref 96–106)
Creatinine, Ser: 0.77 mg/dL (ref 0.76–1.27)
Glucose: 92 mg/dL (ref 70–99)
Potassium: 4 mmol/L (ref 3.5–5.2)
Sodium: 140 mmol/L (ref 134–144)
eGFR: 108 mL/min/{1.73_m2} (ref >59)

## 2021-10-02 LAB — LIPID PANEL
Chol/HDL Ratio: 3.1 ratio (ref 0.0–5.0)
Cholesterol, Total: 132 mg/dL (ref 100–199)
HDL: 43 mg/dL (ref >39)
LDL Chol Calc (NIH): 77 mg/dL (ref 0–99)
Triglycerides: 56 mg/dL (ref 0–149)
VLDL Cholesterol Cal: 12 mg/dL (ref 5–40)

## 2021-10-02 LAB — HEMOGLOBIN A1C
Est. average glucose Bld gHb Est-mCnc: 117 mg/dL
Hgb A1c MFr Bld: 5.7 % — ABNORMAL HIGH (ref 4.8–5.6)

## 2021-11-05 ENCOUNTER — Other Ambulatory Visit: Payer: Self-pay | Admitting: Nurse Practitioner

## 2021-11-12 ENCOUNTER — Other Ambulatory Visit: Payer: Self-pay | Admitting: Nurse Practitioner

## 2021-11-12 DIAGNOSIS — Z6836 Body mass index (BMI) 36.0-36.9, adult: Secondary | ICD-10-CM

## 2021-11-12 DIAGNOSIS — E6609 Other obesity due to excess calories: Secondary | ICD-10-CM

## 2021-11-12 DIAGNOSIS — R7303 Prediabetes: Secondary | ICD-10-CM

## 2021-12-26 ENCOUNTER — Other Ambulatory Visit: Payer: Self-pay | Admitting: Nurse Practitioner

## 2021-12-26 DIAGNOSIS — H9313 Tinnitus, bilateral: Secondary | ICD-10-CM

## 2021-12-30 ENCOUNTER — Other Ambulatory Visit: Payer: Self-pay | Admitting: Nurse Practitioner

## 2021-12-30 DIAGNOSIS — H9313 Tinnitus, bilateral: Secondary | ICD-10-CM

## 2022-04-13 ENCOUNTER — Other Ambulatory Visit: Payer: Self-pay | Admitting: Nurse Practitioner

## 2022-04-21 ENCOUNTER — Encounter: Payer: Self-pay | Admitting: Gastroenterology

## 2022-04-28 ENCOUNTER — Other Ambulatory Visit: Payer: Self-pay | Admitting: Nurse Practitioner

## 2022-04-28 DIAGNOSIS — H9313 Tinnitus, bilateral: Secondary | ICD-10-CM

## 2022-04-28 MED ORDER — ALPRAZOLAM 1 MG PO TABS: ORAL_TABLET | ORAL | 0 refills | Status: DC

## 2022-05-02 ENCOUNTER — Encounter: Payer: Self-pay | Admitting: Gastroenterology

## 2022-05-08 ENCOUNTER — Encounter: Payer: 59 | Admitting: Nurse Practitioner

## 2022-05-29 ENCOUNTER — Other Ambulatory Visit: Payer: Self-pay | Admitting: Nurse Practitioner

## 2022-05-29 DIAGNOSIS — H9313 Tinnitus, bilateral: Secondary | ICD-10-CM

## 2022-05-30 ENCOUNTER — Encounter: Payer: Self-pay | Admitting: Nurse Practitioner

## 2022-05-30 NOTE — Telephone Encounter (Signed)
He needs an appt he has not been seen in the office since January.

## 2022-05-31 ENCOUNTER — Other Ambulatory Visit: Payer: Self-pay | Admitting: Nurse Practitioner

## 2022-05-31 DIAGNOSIS — H9313 Tinnitus, bilateral: Secondary | ICD-10-CM

## 2022-05-31 MED ORDER — ALPRAZOLAM 1 MG PO TABS: ORAL_TABLET | ORAL | 0 refills | Status: DC

## 2022-06-04 ENCOUNTER — Ambulatory Visit (AMBULATORY_SURGERY_CENTER): Payer: Self-pay | Admitting: *Deleted

## 2022-06-04 VITALS — Ht 71.0 in | Wt 254.6 lb

## 2022-06-04 DIAGNOSIS — Z1211 Encounter for screening for malignant neoplasm of colon: Secondary | ICD-10-CM

## 2022-06-04 MED ORDER — NA SULFATE-K SULFATE-MG SULF 17.5-3.13-1.6 GM/177ML PO SOLN: 1.0000 | Freq: Once | ORAL | 0 refills | Status: AC

## 2022-06-04 NOTE — Progress Notes (Signed)
No egg or soy allergy known to patient  No issues known to pt with past sedation with any surgeries or procedures Patient denies ever being told they had issues or difficulty with intubation  No FH of Malignant Hyperthermia Pt is not on diet pills Pt is not on home 02  Pt is not on blood thinners  Pt denies issues with constipation  No A fib or A flutter Have any cardiac testing pending--NO Pt instructed to use Single[employer]com or GoodRx for a price reduction on prep   

## 2022-06-05 ENCOUNTER — Encounter: Payer: Self-pay | Admitting: Nurse Practitioner

## 2022-06-05 ENCOUNTER — Ambulatory Visit: Payer: 59 | Admitting: Nurse Practitioner

## 2022-06-05 VITALS — BP 122/72 | HR 70 | Temp 98.1°F | Ht 71.0 in | Wt 252.4 lb

## 2022-06-05 DIAGNOSIS — E782 Mixed hyperlipidemia: Secondary | ICD-10-CM

## 2022-06-05 DIAGNOSIS — H9313 Tinnitus, bilateral: Secondary | ICD-10-CM | POA: Diagnosis not present

## 2022-06-05 DIAGNOSIS — Z6835 Body mass index (BMI) 35.0-35.9, adult: Secondary | ICD-10-CM

## 2022-06-05 DIAGNOSIS — E1169 Type 2 diabetes mellitus with other specified complication: Secondary | ICD-10-CM

## 2022-06-05 DIAGNOSIS — I10 Essential (primary) hypertension: Secondary | ICD-10-CM | POA: Diagnosis not present

## 2022-06-05 DIAGNOSIS — E669 Obesity, unspecified: Secondary | ICD-10-CM

## 2022-06-05 MED ORDER — LISINOPRIL-HYDROCHLOROTHIAZIDE 20-25 MG PO TABS: 1.0000 | ORAL_TABLET | Freq: Every day | ORAL | 1 refills | Status: DC

## 2022-06-05 MED ORDER — ACCU-CHEK GUIDE VI STRP: ORAL_STRIP | 2 refills | Status: DC

## 2022-06-05 MED ORDER — ALPRAZOLAM 1 MG PO TABS: ORAL_TABLET | ORAL | 0 refills | Status: DC

## 2022-06-05 MED ORDER — ALPRAZOLAM 1 MG PO TABS: ORAL_TABLET | ORAL | 3 refills | Status: DC

## 2022-06-05 MED ORDER — GVOKE HYPOPEN 2-PACK 0.5 MG/0.1ML ~~LOC~~ SOAJ: 1.0000 | SUBCUTANEOUS | 3 refills | Status: AC | PRN

## 2022-06-05 MED ORDER — METFORMIN HCL ER 500 MG PO TB24: 500.0000 mg | ORAL_TABLET | Freq: Two times a day (BID) | ORAL | 1 refills | Status: DC

## 2022-06-05 MED ORDER — OZEMPIC (1 MG/DOSE) 4 MG/3ML ~~LOC~~ SOPN: PEN_INJECTOR | SUBCUTANEOUS | 1 refills | Status: DC

## 2022-06-05 MED ORDER — OMEPRAZOLE 20 MG PO CPDR: 20.0000 mg | DELAYED_RELEASE_CAPSULE | Freq: Every day | ORAL | 1 refills | Status: DC

## 2022-06-05 MED ORDER — ATORVASTATIN CALCIUM 20 MG PO TABS: 20.0000 mg | ORAL_TABLET | Freq: Every day | ORAL | 1 refills | Status: DC

## 2022-06-05 NOTE — Patient Instructions (Signed)
Hypertension, Adult High blood pressure (hypertension) is when the force of blood pumping through the arteries is too strong. The arteries are the blood vessels that carry blood from the heart throughout the body. Hypertension forces the heart to work harder to pump blood and may cause arteries to become narrow or stiff. Untreated or uncontrolled hypertension can lead to a heart attack, heart failure, a stroke, kidney disease, and other problems. A blood pressure reading consists of a higher number over a lower number. Ideally, your blood pressure should be below 120/80. The first ("top") number is called the systolic pressure. It is a measure of the pressure in your arteries as your heart beats. The second ("bottom") number is called the diastolic pressure. It is a measure of the pressure in your arteries as the heart relaxes. What are the causes? The exact cause of this condition is not known. There are some conditions that result in high blood pressure. What increases the risk? Certain factors may make you more likely to develop high blood pressure. Some of these risk factors are under your control, including: Smoking. Not getting enough exercise or physical activity. Being overweight. Having too much fat, sugar, calories, or salt (sodium) in your diet. Drinking too much alcohol. Other risk factors include: Having a personal history of heart disease, diabetes, high cholesterol, or kidney disease. Stress. Having a family history of high blood pressure and high cholesterol. Having obstructive sleep apnea. Age. The risk increases with age. What are the signs or symptoms? High blood pressure may not cause symptoms. Very high blood pressure (hypertensive crisis) may cause: Headache. Fast or irregular heartbeats (palpitations). Shortness of breath. Nosebleed. Nausea and vomiting. Vision changes. Severe chest pain, dizziness, and seizures. How is this diagnosed? This condition is diagnosed by  measuring your blood pressure while you are seated, with your arm resting on a flat surface, your legs uncrossed, and your feet flat on the floor. The cuff of the blood pressure monitor will be placed directly against the skin of your upper arm at the level of your heart. Blood pressure should be measured at least twice using the same arm. Certain conditions can cause a difference in blood pressure between your right and left arms. If you have a high blood pressure reading during one visit or you have normal blood pressure with other risk factors, you may be asked to: Return on a different day to have your blood pressure checked again. Monitor your blood pressure at home for 1 week or longer. If you are diagnosed with hypertension, you may have other blood or imaging tests to help your health care provider understand your overall risk for other conditions. How is this treated? This condition is treated by making healthy lifestyle changes, such as eating healthy foods, exercising more, and reducing your alcohol intake. You may be referred for counseling on a healthy diet and physical activity. Your health care provider may prescribe medicine if lifestyle changes are not enough to get your blood pressure under control and if: Your systolic blood pressure is above 130. Your diastolic blood pressure is above 80. Your personal target blood pressure may vary depending on your medical conditions, your age, and other factors. Follow these instructions at home: Eating and drinking  Eat a diet that is high in fiber and potassium, and low in sodium, added sugar, and fat. An example of this eating plan is called the DASH diet. DASH stands for Dietary Approaches to Stop Hypertension. To eat this way: Eat   plenty of fresh fruits and vegetables. Try to fill one half of your plate at each meal with fruits and vegetables. Eat whole grains, such as whole-wheat pasta, brown rice, or whole-grain bread. Fill about one  fourth of your plate with whole grains. Eat or drink low-fat dairy products, such as skim milk or low-fat yogurt. Avoid fatty cuts of meat, processed or cured meats, and poultry with skin. Fill about one fourth of your plate with lean proteins, such as fish, chicken without skin, beans, eggs, or tofu. Avoid pre-made and processed foods. These tend to be higher in sodium, added sugar, and fat. Reduce your daily sodium intake. Many people with hypertension should eat less than 1,500 mg of sodium a day. Do not drink alcohol if: Your health care provider tells you not to drink. You are pregnant, may be pregnant, or are planning to become pregnant. If you drink alcohol: Limit how much you have to: 0-1 drink a day for women. 0-2 drinks a day for men. Know how much alcohol is in your drink. In the [place] one drink equals one 12 oz bottle of beer (355 mL), one 5 oz glass of wine (148 mL), or one 1 oz glass of hard liquor (44 mL). Lifestyle  Work with your health care provider to maintain a healthy body weight or to lose weight. Ask what an ideal weight is for you. Get at least 30 minutes of exercise that causes your heart to beat faster (aerobic exercise) most days of the week. Activities may include walking, swimming, or biking. Include exercise to strengthen your muscles (resistance exercise), such as Pilates or lifting weights, as part of your weekly exercise routine. Try to do these types of exercises for 30 minutes at least 3 days a week. Do not use any products that contain nicotine or tobacco. These products include cigarettes, chewing tobacco, and vaping devices, such as e-cigarettes. If you need help quitting, ask your health care provider. Monitor your blood pressure at home as told by your health care provider. Keep all follow-up visits. This is important. Medicines Take over-the-counter and prescription medicines only as told by your health care provider. Follow directions carefully. Blood  pressure medicines must be taken as prescribed. Do not skip doses of blood pressure medicine. Doing this puts you at risk for problems and can make the medicine less effective. Ask your health care provider about side effects or reactions to medicines that you should watch for. Contact a health care provider if you: Think you are having a reaction to a medicine you are taking. Have headaches that keep coming back (recurring). Feel dizzy. Have swelling in your ankles. Have trouble with your vision. Get help right away if you: Develop a severe headache or confusion. Have unusual weakness or numbness. Feel faint. Have severe pain in your chest or abdomen. Vomit repeatedly. Have trouble breathing. These symptoms may be an emergency. Get help right away. Call 911. Do not wait to see if the symptoms will go away. Do not drive yourself to the hospital. Summary Hypertension is when the force of blood pumping through your arteries is too strong. If this condition is not controlled, it may put you at risk for serious complications. Your personal target blood pressure may vary depending on your medical conditions, your age, and other factors. For most people, a normal blood pressure is less than 120/80. Hypertension is treated with lifestyle changes, medicines, or a combination of both. Lifestyle changes include losing weight, eating a healthy,   low-sodium diet, exercising more, and limiting alcohol. This information is not intended to replace advice given to you by your health care provider. Make sure you discuss any questions you have with your health care provider. Document Revised: 07/02/2021 Document Reviewed: 07/02/2021 Elsevier Patient Education  2023 [employer]  

## 2022-06-05 NOTE — Progress Notes (Deleted)
   Established Patient Office Visit  Subjective   Patient ID: [name], male    DOB: March 15, 1971  Age: 51 y.o. MRN: [id number]  Chief Complaint  Patient presents with   Hypertension    Hypertension    {History (Optional):23778}  ROS    Objective:     BP 122/72   Pulse 70   Temp 98.1 F (36.7 C) (Oral)   Ht '5\' 11"'$  (1.803 m)   Wt 252 lb 6.4 oz (114.5 kg)   BMI 35.20 kg/m  {Vitals History (Optional):23777}  Physical Exam   No results found for any visits on 06/05/22.  {Labs (Optional):23779}  The 10-year ASCVD risk score (Arnett DK, et al., 2019) is: 8.2%    Assessment & Plan:   Problem List Items Addressed This Visit       Cardiovascular and Mediastinum   Essential hypertension - Primary     Other   Anxiety state   Hyperlipidemia   Prediabetes   Other Visit Diagnoses     Class 2 severe obesity due to excess calories with serious comorbidity and body mass index (BMI) of 35.0 to 35.9 in adult [facility])           Return in about 4 months (around 10/05/2022) for bp check.    [name], FNP

## 2022-06-05 NOTE — Progress Notes (Signed)
I,Lalita  Keobandith,acting as a Neurosurgeon for [employer], FNP.,have documented all relevant documentation on the behalf of Arnette Felts, FNP,as directed by  Arnette Felts, FNP while in the presence of Arnette Felts, FNP.    Subjective:     Patient ID: [name] , male    DOB: [birth year] , 51 y.o.   MRN: [id number]   Chief Complaint  Patient presents with   Hypertension    HPI  Pt here today for DM/ [facility] f/u. Here for tinnitus f/u, taking alprazolam as needed. He is scheduled for his Colonoscopy at [facility] on October 27th. Reports having an episode of low blood sugar where he was jittery, light headed and vomiting.   Wt Readings from Last 3 Encounters: 06/05/22 : 252 lb 6.4 oz (114.5 kg) 06/04/22 : 254 lb 9.6 oz (115.5 kg) 10/01/21 : 245 lb 12.8 oz (111.5 kg)    Diabetes He presents for his follow-up diabetic visit. There are no hypoglycemic associated symptoms. Pertinent negatives for hypoglycemia include no dizziness or headaches. Pertinent negatives for diabetes include no blurred vision. There are no hypoglycemic complications. There are no diabetic complications. Risk factors for coronary artery disease include obesity and male sex. Current diabetic treatment includes oral agent (dual therapy). His weight is stable. He is following a generally healthy diet. When asked about meal planning, he reported none. He has not had a previous visit with a dietitian. He participates in exercise daily [employer] - Friday). (Blood sugar is 95 - 125. )     Past Medical History:  Diagnosis Date   Allergic rhinitis    Anal fissure    Anxiety    Depression    Family history of adverse reaction to anesthesia    mother - hard awaken   Gastric polyp 03/08/2005   Benign   GERD (gastroesophageal reflux disease)    H. pylori infection    Headache(784.0)    Hyperlipidemia 06/04/2012   Hypertension    IBS (irritable bowel syndrome)    Insomnia    Plantar fasciitis    PONV (postoperative nausea  and vomiting)    Pre-diabetes      Family History  Problem Relation Age of Onset   Hypertension Mother    Diabetes Mother    Lung cancer Father    Colon cancer Neg Hx    Colon polyps Neg Hx    Crohn's disease Neg Hx    Esophageal cancer Neg Hx    Rectal cancer Neg Hx    Stomach cancer Neg Hx    Ulcerative colitis Neg Hx      Current Outpatient Medications:    Accu-Chek Softclix Lancets lancets, Use to check blood sugars once daily R73.03, Disp: 100 each, Rfl: 12   Blood Glucose Monitoring Suppl (ACCU-CHEK GUIDE) w/Device KIT, Check blood sugars once daily R73.03, Disp: 1 kit, Rfl: 1   Glucagon (GVOKE HYPOPEN 2-PACK) 0.5 MG/0.1ML SOAJ, Inject 1 each into the skin as needed., Disp: 0.1 mL, Rfl: 3   ALPRAZolam (XANAX) 1 MG tablet, TAKE 1 TABLET BY MOUTH EVERY NIGHT AT BEDTIME AS NEEDED FOR ANXIETY OR SLEEP, Disp: 30 tablet, Rfl: 0   atorvastatin (LIPITOR) 20 MG tablet, Take 1 tablet (20 mg total) by mouth daily., Disp: 90 tablet, Rfl: 1   glucose blood (ACCU-CHEK GUIDE) test strip, Use to check blood sugars once daily R73.03, Disp: 100 each, Rfl: 2   lisinopril-hydrochlorothiazide (ZESTORETIC) 20-25 MG tablet, Take 1 tablet by mouth daily., Disp: 90 tablet, Rfl: 1  metFORMIN (GLUCOPHAGE-XR) 500 MG 24 hr tablet, Take 1 tablet (500 mg total) by mouth 2 (two) times daily., Disp: 180 tablet, Rfl: 1   omeprazole (PRILOSEC) 20 MG capsule, Take 1 capsule (20 mg total) by mouth daily., Disp: 90 capsule, Rfl: 1   Semaglutide, 1 MG/DOSE, (OZEMPIC, 1 MG/DOSE,) 4 MG/3ML SOPN, INJECT SUBCUTANEOUSLY 1 MG EVERY WEEK, Disp: 9 mL, Rfl: 1   Allergies  Allergen Reactions   Hydrocodone     Headache, hypersensitivity     Review of Systems  Constitutional: Negative.   Eyes:  Negative for blurred vision.  Respiratory: Negative.    Cardiovascular: Negative.   Gastrointestinal: Negative.   Neurological: Negative.  Negative for dizziness and headaches.     Today's Vitals   06/05/22 0825  BP:  122/72  Pulse: 70  Temp: 98.1 F (36.7 C)  TempSrc: Oral  Weight: 252 lb 6.4 oz (114.5 kg)  Height: $Remove'5\' 11"'UbiFDXj$  (1.803 m)   Body mass index is 35.2 kg/m.  Wt Readings from Last 3 Encounters:  06/05/22 252 lb 6.4 oz (114.5 kg)  06/04/22 254 lb 9.6 oz (115.5 kg)  10/01/21 245 lb 12.8 oz (111.5 kg)    Objective:  Physical Exam Vitals reviewed.  Constitutional:      General: He is not in acute distress.    Appearance: Normal appearance.  Cardiovascular:     Rate and Rhythm: Normal rate and regular rhythm.     Pulses: Normal pulses.     Heart sounds: Normal heart sounds. No murmur heard. Pulmonary:     Effort: Pulmonary effort is normal. No respiratory distress.     Breath sounds: Normal breath sounds. No wheezing.  Skin:    General: Skin is warm.     Capillary Refill: Capillary refill takes less than 2 seconds.  Neurological:     General: No focal deficit present.     Mental Status: He is alert and oriented to person, place, and time.     Cranial Nerves: No cranial nerve deficit.     Motor: No weakness.  Psychiatric:        Mood and Affect: Mood normal.        Behavior: Behavior normal.        Thought Content: Thought content normal.        Judgment: Judgment normal.         Assessment And Plan:     1. Essential hypertension Comments: Blood pressure is well controlled, continue current medications - lisinopril-hydrochlorothiazide (ZESTORETIC) 20-25 MG tablet; Take 1 tablet by mouth daily.  Dispense: 90 tablet; Refill: 1 - CMP14+EGFR  2. Diabetes mellitus type 2 in obese [facility]) Comments: Controlled, HgbA1c was at 6.5 in April 2022, now down to 5.9. Continue Ozempic and metformin. GVoke order given if blood sugar drops below 65 - metFORMIN (GLUCOPHAGE-XR) 500 MG 24 hr tablet; Take 1 tablet (500 mg total) by mouth 2 (two) times daily.  Dispense: 180 tablet; Refill: 1 - Semaglutide, 1 MG/DOSE, (OZEMPIC, 1 MG/DOSE,) 4 MG/3ML SOPN; INJECT SUBCUTANEOUSLY 1 MG EVERY WEEK   Dispense: 9 mL; Refill: 1 - glucose blood (ACCU-CHEK GUIDE) test strip; Use to check blood sugars once daily R73.03  Dispense: 100 each; Refill: 2 - Glucagon (GVOKE HYPOPEN 2-PACK) 0.5 MG/0.1ML SOAJ; Inject 1 each into the skin as needed.  Dispense: 0.1 mL; Refill: 3 - Hemoglobin A1c - CMP14+EGFR  3. Mixed hyperlipidemia Comments: Controlled, continue current medications - CMP14+EGFR  4. Class 2 severe obesity due to excess calories with  serious comorbidity and body mass index (BMI) of 35.0 to 35.9 in adult [facility]) Comments: Continue exercing regularly and eating a healthy diet low in sugar and starches.   5. Tinnitus of both ears Comments: Takes alprazolam as needed. Has had a full work up including at the tinnitus clinic with no relief.  - ALPRAZolam (XANAX) 1 MG tablet; TAKE 1 TABLET BY MOUTH EVERY NIGHT AT BEDTIME AS NEEDED FOR ANXIETY OR SLEEP  Dispense: 30 tablet; Refill: 0     Patient was given opportunity to ask questions. Patient verbalized understanding of the plan and was able to repeat key elements of the plan. All questions were answered to their satisfaction.  [name], FNP   I, [name], FNP, have reviewed all documentation for this visit. The documentation on 06/05/22 for the exam, diagnosis, procedures, and orders are all accurate and complete.   IF YOU HAVE BEEN REFERRED TO A SPECIALIST, IT MAY TAKE 1-2 WEEKS TO SCHEDULE/PROCESS THE REFERRAL. IF YOU HAVE NOT HEARD FROM US/SPECIALIST IN TWO WEEKS, PLEASE GIVE [place] A CALL AT [phone] X [phone].   THE PATIENT IS ENCOURAGED TO PRACTICE SOCIAL DISTANCING DUE TO THE COVID-19 PANDEMIC.

## 2022-06-06 LAB — HEMOGLOBIN A1C
Est. average glucose Bld gHb Est-mCnc: 111 mg/dL
Hgb A1c MFr Bld: 5.5 % (ref 4.8–5.6)

## 2022-06-06 LAB — CMP14+EGFR
ALT: 28 IU/L (ref 0–44)
AST: 16 IU/L (ref 0–40)
Albumin/Globulin Ratio: 2.1 (ref 1.2–2.2)
Albumin: 5.2 g/dL — ABNORMAL HIGH (ref 3.8–4.9)
Alkaline Phosphatase: 60 IU/L (ref 44–121)
BUN/Creatinine Ratio: 25 — ABNORMAL HIGH (ref 9–20)
BUN: 22 mg/dL (ref 6–24)
Bilirubin Total: 0.4 mg/dL (ref 0.0–1.2)
CO2: 25 mmol/L (ref 20–29)
Calcium: 9.8 mg/dL (ref 8.7–10.2)
Chloride: 96 mmol/L (ref 96–106)
Creatinine, Ser: 0.89 mg/dL (ref 0.76–1.27)
Globulin, Total: 2.5 g/dL (ref 1.5–4.5)
Glucose: 88 mg/dL (ref 70–99)
Potassium: 4.1 mmol/L (ref 3.5–5.2)
Sodium: 139 mmol/L (ref 134–144)
Total Protein: 7.7 g/dL (ref 6.0–8.5)
eGFR: 103 mL/min/{1.73_m2} (ref >59)

## 2022-06-26 ENCOUNTER — Encounter: Payer: Self-pay | Admitting: Gastroenterology

## 2022-06-28 ENCOUNTER — Encounter: Payer: Self-pay | Admitting: Certified Registered Nurse Anesthetist

## 2022-07-04 ENCOUNTER — Ambulatory Visit (AMBULATORY_SURGERY_CENTER): Payer: 59 | Admitting: Gastroenterology

## 2022-07-04 ENCOUNTER — Encounter: Payer: Self-pay | Admitting: Gastroenterology

## 2022-07-04 VITALS — BP 114/66 | HR 75 | Temp 97.9°F | Resp 18 | Ht 71.0 in | Wt 254.0 lb

## 2022-07-04 DIAGNOSIS — Z1211 Encounter for screening for malignant neoplasm of colon: Secondary | ICD-10-CM | POA: Diagnosis present

## 2022-07-04 DIAGNOSIS — K635 Polyp of colon: Secondary | ICD-10-CM | POA: Diagnosis not present

## 2022-07-04 DIAGNOSIS — D123 Benign neoplasm of transverse colon: Secondary | ICD-10-CM

## 2022-07-04 HISTORY — PX: COLONOSCOPY: SHX174

## 2022-07-04 MED ORDER — SODIUM CHLORIDE 0.9 % IV SOLN: 500.0000 mL | INTRAVENOUS | Status: DC

## 2022-07-04 NOTE — Op Note (Signed)
[facility] Patient Name: [name] Procedure Date: 07/04/2022 10:24 AM MRN: [id number] Endoscopist: Hutchinson. Loletha Carrow , MD, [id number] Age: 51 Referring MD:  Date of Birth: 26-Sep-1970 Gender: Male Account #: [id number] Procedure:                Colonoscopy Indications:              Screening for colorectal malignant neoplasm, This                            is the patient's first screening colonoscopy Medicines:                Monitored Anesthesia Care Procedure:                Pre-Anesthesia Assessment:                           - Prior to the procedure, a History and Physical                            was performed, and patient medications and                            allergies were reviewed. The patient's tolerance of                            previous anesthesia was also reviewed. The risks                            and benefits of the procedure and the sedation                            options and risks were discussed with the patient.                            All questions were answered, and informed consent                            was obtained. Prior Anticoagulants: The patient has                            taken no anticoagulant or antiplatelet agents. ASA                            Grade Assessment: II - A patient with mild systemic                            disease. After reviewing the risks and benefits,                            the patient was deemed in satisfactory condition to                            undergo the procedure.  After obtaining informed consent, the colonoscope                            was passed under direct vision. Throughout the                            procedure, the patient's blood pressure, pulse, and                            oxygen saturations were monitored continuously. The                            CF [id number] #[id number] was introduced through the anus                            and advanced  to the the cecum, identified by                            appendiceal orifice and ileocecal valve. The                            colonoscopy was somewhat difficult due to a                            redundant colon and significant looping. Successful                            completion of the procedure was aided by changing                            the patient to a semi-prone position, using manual                            pressure, straightening and shortening the scope to                            obtain bowel loop reduction and lavage. The patient                            tolerated the procedure well. The quality of the                            bowel preparation was initially fair and improved                            to good with lavage (some residual scattered                            fibrous debris) - patient reported difficulty                            tolerating bowel prep. The ileocecal valve,  appendiceal orifice, and rectum were photographed. Scope In: 10:41:45 AM Scope Out: 10:59:42 AM Scope Withdrawal Time: 0 hours 11 minutes 35 seconds  Total Procedure Duration: 0 hours 17 minutes 57 seconds  Findings:                 The perianal and digital rectal examinations were                            normal except for posterior anal fibrosis from                            prior fissure repair.                           Repeat examination of right colon under NBI                            performed.                           A 4-5 mm polyp was found in the transverse colon.                            The polyp was semi-sessile. The polyp was removed                            with a cold snare. Resection and retrieval were                            complete.                           The exam was otherwise without abnormality on                            direct and retroflexion views. Complications:            No immediate  complications. Estimated Blood Loss:     Estimated blood loss was minimal. Impression:               - One 4-5 mm polyp in the transverse colon, removed                            with a cold snare. Resected and retrieved.                           - The examination was otherwise normal on direct                            and retroflexion views. Recommendation:           - Patient has a contact number available for                            emergencies. The signs and symptoms of potential  delayed complications were discussed with the                            patient. Return to normal activities tomorrow.                            Written discharge instructions were provided to the                            patient.                           - Resume previous diet.                           - Continue present medications.                           - Await pathology results.                           - Repeat colonoscopy is recommended for                            surveillance. The colonoscopy date will be                            determined after pathology results from today's                            exam become available for review. (for next exam's                            prep - metoclopramide before evening and AM doses,                            and consume more water with and after prep solution) Timothy Trudell L. Loletha Carrow, MD 07/04/2022 11:06:21 AM This report has been signed electronically.

## 2022-07-04 NOTE — Progress Notes (Signed)
Report given to PACU, vss 

## 2022-07-04 NOTE — Progress Notes (Signed)
Called to room to assist during endoscopic procedure.  Patient ID and intended procedure confirmed with present staff. Received instructions for my participation in the procedure from the performing physician.  

## 2022-07-04 NOTE — Patient Instructions (Signed)
Read all of the handouts given to you by your recovery room nurse.  Resume all of your medications.  YOU HAD AN ENDOSCOPIC PROCEDURE TODAY AT [facility]:   Refer to the procedure report that was given to you for any specific questions about what was found during the examination.  If the procedure report does not answer your questions, please call your gastroenterologist to clarify.  If you requested that your care partner not be given the details of your procedure findings, then the procedure report has been included in a sealed envelope for you to review at your convenience later.  YOU SHOULD EXPECT: Some feelings of bloating in the abdomen. Passage of more gas than usual.  Walking can help get rid of the air that was put into your GI tract during the procedure and reduce the bloating. If you had a lower endoscopy (such as a colonoscopy or flexible sigmoidoscopy) you may notice spotting of blood in your stool or on the toilet paper. If you underwent a bowel prep for your procedure, you may not have a normal bowel movement for a few days.  Please Note:  You might notice some irritation and congestion in your nose or some drainage.  This is from the oxygen used during your procedure.  There is no need for concern and it should clear up in a day or so.  SYMPTOMS TO REPORT IMMEDIATELY:  Following lower endoscopy (colonoscopy or flexible sigmoidoscopy):  Excessive amounts of blood in the stool  Significant tenderness or worsening of abdominal pains  Swelling of the abdomen that is new, acute  Fever of 100F or higher   For urgent or emergent issues, a gastroenterologist can be reached at any hour by calling [phone]. Do not use MyChart messaging for urgent concerns.    DIET:  We do recommend a small meal at first, but then you may proceed to your regular diet.  Drink plenty of fluids but you should avoid alcoholic beverages for 24 hours.  ACTIVITY:  You should plan to take  it easy for the rest of today and you should NOT DRIVE or use heavy machinery until tomorrow (because of the sedation medicines used during the test).    FOLLOW UP: Our staff will call the number listed on your records the next business day following your procedure.  We will call around 7:15- 8:00 am to check on you and address any questions or concerns that you may have regarding the information given to you following your procedure. If we do not reach you, we will leave a message.     If any biopsies were taken you will be contacted by phone or by letter within the next 1-3 weeks.  Please call us at [phone] if you have not heard about the biopsies in 3 weeks.    SIGNATURES/CONFIDENTIALITY: You and/or your care partner have signed paperwork which will be entered into your electronic medical record.  These signatures attest to the fact that that the information above on your After Visit Summary has been reviewed and is understood.  Full responsibility of the confidentiality of this discharge information lies with you and/or your care-partner.

## 2022-07-04 NOTE — Progress Notes (Signed)
History and Physical:  This patient presents for endoscopic testing for: Encounter Diagnosis  Name Primary?   Special screening for malignant neoplasms, colon Yes    51 year old man here for screening colonoscopy.  He was seen in the office February 2020 for benign anal bleeding.  He had a diagnostic colonoscopy with Dr. Olevia Perches in 2013 for anorectal bleeding and a persistent anal fissure.  Patient is otherwise without complaints or active issues today.   Past Medical History: Past Medical History:  Diagnosis Date   Allergic rhinitis    Anal fissure    Anxiety    Depression    Family history of adverse reaction to anesthesia    mother - hard awaken   Gastric polyp 03/08/2005   Benign   GERD (gastroesophageal reflux disease)    H. pylori infection    Headache(784.0)    Hyperlipidemia 06/04/2012   Hypertension    IBS (irritable bowel syndrome)    Insomnia    Plantar fasciitis    PONV (postoperative nausea and vomiting)    Pre-diabetes      Past Surgical History: Past Surgical History:  Procedure Laterality Date   ANAL FISSURE REPAIR  09/08/2001   COLONOSCOPY     LUMBAR LAMINECTOMY/DECOMPRESSION MICRODISCECTOMY N/A 09/20/2015   Procedure: LUMBAR LAMINECTOMY/DECOMPRESSION MICRODISCECTOMY 2 LEVELS L3-4 L4-5;  Surgeon: Susa Day, MD;  Location: [facility];  Service: Orthopedics;  Laterality: N/A;   ROTATOR CUFF REPAIR     VASECTOMY      Allergies: Allergies  Allergen Reactions   Hydrocodone     Headache, hypersensitivity    Outpatient Meds: Current Outpatient Medications  Medication Sig Dispense Refill   ALPRAZolam (XANAX) 1 MG tablet TAKE 1 TABLET BY MOUTH EVERY NIGHT AT BEDTIME AS NEEDED FOR ANXIETY OR SLEEP 30 tablet 0   atorvastatin (LIPITOR) 20 MG tablet Take 1 tablet (20 mg total) by mouth daily. 90 tablet 1   lisinopril-hydrochlorothiazide (ZESTORETIC) 20-25 MG tablet Take 1 tablet by mouth daily. 90 tablet 1   omeprazole (PRILOSEC) 20 MG capsule Take 1  capsule (20 mg total) by mouth daily. 90 capsule 1   Accu-Chek Softclix Lancets lancets Use to check blood sugars once daily R73.03 100 each 12   Blood Glucose Monitoring Suppl (ACCU-CHEK GUIDE) w/Device KIT Check blood sugars once daily R73.03 1 kit 1   Glucagon (GVOKE HYPOPEN 2-PACK) 0.5 MG/0.1ML SOAJ Inject 1 each into the skin as needed. 0.1 mL 3   glucose blood (ACCU-CHEK GUIDE) test strip Use to check blood sugars once daily R73.03 100 each 2   metFORMIN (GLUCOPHAGE-XR) 500 MG 24 hr tablet Take 1 tablet (500 mg total) by mouth 2 (two) times daily. 180 tablet 1   Semaglutide, 1 MG/DOSE, (OZEMPIC, 1 MG/DOSE,) 4 MG/3ML SOPN INJECT SUBCUTANEOUSLY 1 MG EVERY WEEK 9 mL 1   Current Facility-Administered Medications  Medication Dose Route Frequency Provider Last Rate Last Admin   0.9 %  sodium chloride infusion  500 mL Intravenous Continuous Nelida Meuse III, MD          ___________________________________________________________________ Objective   Exam:  BP 137/84   Pulse 82   Temp 97.9 F (36.6 C)   Ht _0  (1.803 m)   Wt 254 lb (115.2 kg)   SpO2 99%   BMI 35.43 kg/m   CV: regular , S1/S2 Resp: clear to auscultation bilaterally, normal RR and effort noted GI: soft, no tenderness, with active bowel sounds.   Assessment: Encounter Diagnosis  Name Primary?   Special  screening for malignant neoplasms, colon Yes     Plan: Colonoscopy  The benefits and risks of the planned procedure were described in detail with the patient or (when appropriate) their health care proxy.  Risks were outlined as including, but not limited to, bleeding, infection, perforation, adverse medication reaction leading to cardiac or pulmonary decompensation, pancreatitis (if ERCP).  The limitation of incomplete mucosal visualization was also discussed.  No guarantees or warranties were given.    The patient is appropriate for an endoscopic procedure in the ambulatory setting.   - Wilfrid Lund,  MD

## 2022-07-07 ENCOUNTER — Telehealth: Payer: Self-pay

## 2022-07-07 NOTE — Telephone Encounter (Signed)
  Follow up Call-     07/04/2022    9:55 AM  Call back number  Post procedure Call Back phone  # [phone]  Permission to leave phone message Yes     Patient questions:  Do you have a fever, pain , or abdominal swelling? No. Pain Score  0 *  Have you tolerated food without any problems? Yes.    Have you been able to return to your normal activities? Yes.    Do you have any questions about your discharge instructions: Diet   No. Medications  No. Follow up visit  No.  Do you have questions or concerns about your Care? No.  Actions: * If pain score is 4 or above: No action needed, pain <4.

## 2022-07-08 ENCOUNTER — Encounter: Payer: Self-pay | Admitting: Nurse Practitioner

## 2022-07-08 ENCOUNTER — Ambulatory Visit: Payer: 59 | Admitting: Nurse Practitioner

## 2022-07-08 VITALS — BP 122/80 | HR 83 | Temp 98.2°F | Ht 71.0 in | Wt 251.4 lb

## 2022-07-08 DIAGNOSIS — R051 Acute cough: Secondary | ICD-10-CM

## 2022-07-08 DIAGNOSIS — J069 Acute upper respiratory infection, unspecified: Secondary | ICD-10-CM | POA: Diagnosis not present

## 2022-07-08 DIAGNOSIS — J029 Acute pharyngitis, unspecified: Secondary | ICD-10-CM | POA: Diagnosis not present

## 2022-07-08 DIAGNOSIS — I10 Essential (primary) hypertension: Secondary | ICD-10-CM | POA: Diagnosis not present

## 2022-07-08 DIAGNOSIS — R7303 Prediabetes: Secondary | ICD-10-CM

## 2022-07-08 DIAGNOSIS — Z6835 Body mass index (BMI) 35.0-35.9, adult: Secondary | ICD-10-CM

## 2022-07-08 LAB — POCT RAPID STREP A (OFFICE): Rapid Strep A Screen: NEGATIVE

## 2022-07-08 LAB — POC COVID19 BINAXNOW: SARS Coronavirus 2 Ag: NEGATIVE

## 2022-07-08 MED ORDER — PROMETHAZINE-DM 6.25-15 MG/5ML PO SYRP: 5.0000 mL | ORAL_SOLUTION | Freq: Four times a day (QID) | ORAL | 0 refills | Status: DC | PRN

## 2022-07-08 MED ORDER — AZITHROMYCIN 250 MG PO TABS: ORAL_TABLET | ORAL | 0 refills | Status: AC

## 2022-07-08 NOTE — Patient Instructions (Signed)
Sore Throat When you have a sore throat, your throat may feel: Tender. Burning. Irritated. Scratchy. Painful when you swallow. Painful when you talk. Many things can cause a sore throat, such as: An infection. Allergies. Dry air. Smoke or pollution. Radiation treatment for cancer. Gastroesophageal reflux disease (GERD). A tumor. A sore throat can be the first sign of another sickness. It can happen with other problems, like: Coughing. Sneezing. Fever. Swelling of the glands in the neck. Most sore throats go away without treatment. Follow these instructions at home:     Medicines Take over-the-counter and prescription medicines only as told by your doctor. Children often get sore throats. Do not give your child aspirin. Use throat sprays to soothe your throat as told by your health care provider. Managing pain To help with pain: Sip warm liquids, such as broth, herbal tea, or warm water. Eat or drink cold or frozen liquids, such as frozen ice pops. Rinse your mouth (gargle) with a salt water mixture 3-4 times a day or as needed. To make salt water, dissolve -1 tsp (3-6 g) of salt in 1 cup (237 mL) of warm water. Do not swallow this mixture. Suck on hard candy or throat lozenges. Put a cool-mist humidifier in your bedroom at night. Sit in the bathroom with the door closed for 5-10 minutes while you run hot water in the shower. General instructions Do not smoke or use any products that contain nicotine or tobacco. If you need help quitting, ask your doctor. Get plenty of rest. Drink enough fluid to keep your pee (urine) pale yellow. Wash your hands often for at least 20 seconds with soap and water. If soap and water are not available, use hand sanitizer. Contact a doctor if: You have a fever for more than 2-3 days. You keep having symptoms for more than 2-3 days. Your throat does not get better in 7 days. You have a fever and your symptoms suddenly get worse. Your  child who is 3 months to 3 years old has a temperature of 102.2F (39C) or higher. Get help right away if: You have trouble breathing. You cannot swallow fluids, soft foods, or your spit. You have swelling in your throat or neck that gets worse. You feel like you may vomit (nauseous) and this feeling lasts a long time. You cannot stop vomiting. These symptoms may be an emergency. Get help right away. Call your local emergency services (911 in the [place] Do not wait to see if the symptoms will go away. Do not drive yourself to the hospital. Summary A sore throat is a painful, burning, irritated, or scratchy throat. Many things can cause a sore throat. Take over-the-counter medicines only as told by your doctor. Get plenty of rest. Drink enough fluid to keep your pee (urine) pale yellow. Contact a doctor if your symptoms get worse or your sore throat does not get better within 7 days. This information is not intended to replace advice given to you by your health care provider. Make sure you discuss any questions you have with your health care provider. Document Revised: 11/21/2020 Document Reviewed: 11/21/2020 Elsevier Patient Education  2023 [employer]  

## 2022-07-08 NOTE — Progress Notes (Signed)
I,Victoria T Hamilton,acting as a Education administrator for [name], FNP.,have documented all relevant documentation on the behalf of [name], FNP,as directed by  [name], FNP while in the presence of [name], [place].    Subjective:     Patient ID: [name] , male    DOB: 1970/12/31 , 51 y.o.   MRN: [id number]   Chief Complaint  Patient presents with   Sore Throat    HPI  Pt presents today with possible strep along with cough with mucous production. He is having trouble sleeping due to the cough. Hurts to swallow, scratchy feeling.   He reports initially started on Saturday. He has taken OTC coricidin, sugar free cough drops. He took a cough and flu medication.   Sore Throat  This is a new problem. The current episode started in the past 7 days. There has been no fever. Associated symptoms include congestion, coughing and trouble swallowing. Pertinent negatives include no abdominal pain, headaches or shortness of breath. He has had no exposure to strep.     Past Medical History:  Diagnosis Date   Allergic rhinitis    Anal fissure    Anxiety    Depression    Family history of adverse reaction to anesthesia    mother - hard awaken   Gastric polyp 03/08/2005   Benign   GERD (gastroesophageal reflux disease)    H. pylori infection    Headache(784.0)    Hyperlipidemia 06/04/2012   Hypertension    IBS (irritable bowel syndrome)    Insomnia    Plantar fasciitis    PONV (postoperative nausea and vomiting)    Pre-diabetes      Family History  Problem Relation Age of Onset   Hypertension Mother    Diabetes Mother    Lung cancer Father    Colon cancer Neg Hx    Colon polyps Neg Hx    Crohn's disease Neg Hx    Esophageal cancer Neg Hx    Rectal cancer Neg Hx    Stomach cancer Neg Hx    Ulcerative colitis Neg Hx      Current Outpatient Medications:    Accu-Chek Softclix Lancets lancets, Use to check blood sugars once daily R73.03, Disp: 100 each, Rfl: 12    ALPRAZolam (XANAX) 1 MG tablet, TAKE 1 TABLET BY MOUTH EVERY NIGHT AT BEDTIME AS NEEDED FOR ANXIETY OR SLEEP, Disp: 30 tablet, Rfl: 0   atorvastatin (LIPITOR) 20 MG tablet, Take 1 tablet (20 mg total) by mouth daily., Disp: 90 tablet, Rfl: 1   Blood Glucose Monitoring Suppl (ACCU-CHEK GUIDE) w/Device KIT, Check blood sugars once daily R73.03, Disp: 1 kit, Rfl: 1   glucose blood (ACCU-CHEK GUIDE) test strip, Use to check blood sugars once daily R73.03, Disp: 100 each, Rfl: 2   lisinopril-hydrochlorothiazide (ZESTORETIC) 20-25 MG tablet, Take 1 tablet by mouth daily., Disp: 90 tablet, Rfl: 1   metFORMIN (GLUCOPHAGE-XR) 500 MG 24 hr tablet, Take 1 tablet (500 mg total) by mouth 2 (two) times daily., Disp: 180 tablet, Rfl: 1   omeprazole (PRILOSEC) 20 MG capsule, Take 1 capsule (20 mg total) by mouth daily., Disp: 90 capsule, Rfl: 1   promethazine-dextromethorphan (PROMETHAZINE-DM) 6.25-15 MG/5ML syrup, Take 5 mLs by mouth 4 (four) times daily as needed for cough., Disp: 180 mL, Rfl: 0   Semaglutide, 1 MG/DOSE, (OZEMPIC, 1 MG/DOSE,) 4 MG/3ML SOPN, INJECT SUBCUTANEOUSLY 1 MG EVERY WEEK, Disp: 9 mL, Rfl: 1   traMADol (ULTRAM) 50 MG tablet, Take 50  mg by mouth at bedtime., Disp: , Rfl:    Glucagon (GVOKE HYPOPEN 2-PACK) 0.5 MG/0.1ML SOAJ, Inject 1 each into the skin as needed. (Patient not taking: Reported on 07/08/2022), Disp: 0.1 mL, Rfl: 3   Allergies  Allergen Reactions   Hydrocodone     Headache, hypersensitivity     Review of Systems  Constitutional: Negative.   HENT:  Positive for congestion and trouble swallowing.   Respiratory:  Positive for cough. Negative for shortness of breath.   Gastrointestinal: Negative.  Negative for abdominal pain.  Musculoskeletal: Negative.   Skin: Negative.   Allergic/Immunologic: Negative.   Neurological: Negative.  Negative for headaches.     Today's Vitals   07/08/22 1501  BP: 122/80  Pulse: 83  Temp: 98.2 F (36.8 C)  SpO2: 98%  Weight: 251 lb  6.4 oz (114 kg)  Height: _0  (1.803 m)  PainSc: 0-No pain   Body mass index is 35.06 kg/m.   Objective:  Physical Exam Vitals reviewed.  Constitutional:      Appearance: He is well-developed.  Cardiovascular:     Rate and Rhythm: Normal rate.     Heart sounds: Normal heart sounds. No murmur heard. Skin:    General: Skin is warm and dry.     Capillary Refill: Capillary refill takes less than 2 seconds.  Neurological:     Mental Status: He is alert.  Psychiatric:        Mood and Affect: Mood normal.        Behavior: Behavior normal.         Assessment And Plan:     1. Sore throat Comments: Postnasal drainage noted to posterior oropharynx. - POCT rapid strep A - POC COVID-19 BinaxNow - azithromycin (ZITHROMAX) 250 MG tablet; Take 2 tablets (500 mg) on  Day 1,  followed by 1 tablet (250 mg) once daily on Days 2 through 5.  Dispense: 6 each; Refill: 0 - Novel Coronavirus, NAA ([facility])  2. Acute cough - promethazine-dextromethorphan (PROMETHAZINE-DM) 6.25-15 MG/5ML syrup; Take 5 mLs by mouth 4 (four) times daily as needed for cough.  Dispense: 180 mL; Refill: 0 - azithromycin (ZITHROMAX) 250 MG tablet; Take 2 tablets (500 mg) on  Day 1,  followed by 1 tablet (250 mg) once daily on Days 2 through 5.  Dispense: 6 each; Refill: 0 - Novel Coronavirus, NAA ([facility])  3. Upper respiratory tract infection, unspecified type - azithromycin (ZITHROMAX) 250 MG tablet; Take 2 tablets (500 mg) on  Day 1,  followed by 1 tablet (250 mg) once daily on Days 2 through 5.  Dispense: 6 each; Refill: 0  4. Essential hypertension Comments: Blood pressure is slightly elevated to diastolic, advised to cut back on high salt foods. He is advised to take coricidan HBP.  5. Prediabetes Comments: Continue current medications, tolerating well.  6. Class 2 severe obesity due to excess calories with serious comorbidity and body mass index (BMI) of 35.0 to 35.9 in adult [facility]) He is encouraged to  strive for BMI less than 30 to decrease cardiac risk. Advised to aim for at least 150 minutes of exercise per week.    Patient was given opportunity to ask questions. Patient verbalized understanding of the plan and was able to repeat key elements of the plan. All questions were answered to their satisfaction.  [name], FNP   I, [name], FNP, have reviewed all documentation for this visit. The documentation on 07/08/22 for the exam, diagnosis, procedures, and orders are  all accurate and complete.   IF YOU HAVE BEEN REFERRED TO A SPECIALIST, IT MAY TAKE 1-2 WEEKS TO SCHEDULE/PROCESS THE REFERRAL. IF YOU HAVE NOT HEARD FROM US/SPECIALIST IN TWO WEEKS, PLEASE GIVE [place] A CALL AT [phone].   THE PATIENT IS ENCOURAGED TO PRACTICE SOCIAL DISTANCING DUE TO THE COVID-19 PANDEMIC.

## 2022-07-09 LAB — NOVEL CORONAVIRUS, NAA: SARS-CoV-2, NAA: NOT DETECTED

## 2022-07-11 ENCOUNTER — Encounter: Payer: Self-pay | Admitting: Gastroenterology

## 2022-09-06 ENCOUNTER — Other Ambulatory Visit: Payer: Self-pay | Admitting: Nurse Practitioner

## 2022-09-06 DIAGNOSIS — H9313 Tinnitus, bilateral: Secondary | ICD-10-CM

## 2022-09-10 ENCOUNTER — Other Ambulatory Visit: Payer: Self-pay | Admitting: Nurse Practitioner

## 2022-09-10 DIAGNOSIS — H9313 Tinnitus, bilateral: Secondary | ICD-10-CM

## 2022-09-10 MED ORDER — ALPRAZOLAM 1 MG PO TABS: ORAL_TABLET | ORAL | 1 refills | Status: DC

## 2022-10-06 ENCOUNTER — Ambulatory Visit: Payer: 59 | Admitting: Nurse Practitioner

## 2022-10-13 DIAGNOSIS — F418 Other specified anxiety disorders: Secondary | ICD-10-CM | POA: Insufficient documentation

## 2022-10-14 ENCOUNTER — Ambulatory Visit: Payer: 59 | Admitting: Nurse Practitioner

## 2022-10-14 ENCOUNTER — Encounter: Payer: Self-pay | Admitting: Nurse Practitioner

## 2022-10-14 VITALS — BP 134/68 | HR 87 | Temp 97.7°F | Ht 71.0 in | Wt 245.0 lb

## 2022-10-14 DIAGNOSIS — E782 Mixed hyperlipidemia: Secondary | ICD-10-CM

## 2022-10-14 DIAGNOSIS — E1169 Type 2 diabetes mellitus with other specified complication: Secondary | ICD-10-CM | POA: Diagnosis not present

## 2022-10-14 DIAGNOSIS — H9313 Tinnitus, bilateral: Secondary | ICD-10-CM

## 2022-10-14 DIAGNOSIS — I10 Essential (primary) hypertension: Secondary | ICD-10-CM | POA: Diagnosis not present

## 2022-10-14 DIAGNOSIS — E6609 Other obesity due to excess calories: Secondary | ICD-10-CM

## 2022-10-14 DIAGNOSIS — Z6834 Body mass index (BMI) 34.0-34.9, adult: Secondary | ICD-10-CM

## 2022-10-14 DIAGNOSIS — J302 Other seasonal allergic rhinitis: Secondary | ICD-10-CM

## 2022-10-14 DIAGNOSIS — R110 Nausea: Secondary | ICD-10-CM | POA: Diagnosis not present

## 2022-10-14 MED ORDER — AZELASTINE HCL 0.1 % NA SOLN: 2.0000 | Freq: Two times a day (BID) | NASAL | 6 refills | Status: DC

## 2022-10-14 MED ORDER — ONDANSETRON HCL 4 MG PO TABS: 4.0000 mg | ORAL_TABLET | Freq: Every day | ORAL | 1 refills | Status: DC | PRN

## 2022-10-14 NOTE — Patient Instructions (Signed)
Bland Diet A bland diet may consist of soft foods or foods that are not high in fat or are not greasy, acidic, or spicy. Avoiding certain foods may cause less irritation to your mouth, throat, stomach, or gastrointestinal tract. Avoiding certain foods may make you feel better. Everyone's tolerances are different. A bland diet should be based on what you can tolerate and what may cause discomfort. What is my plan? Your health care provider or dietitian may recommend specific changes to your diet to treat your symptoms. These changes may include: Eating small meals frequently. Cooking food until it is soft enough to chew easily. Taking the time to chew your food thoroughly, so it is easy to swallow and digest. Avoiding foods that cause you discomfort. These may include spicy food, fried food, greasy foods, hard-to-chew foods, or citrus fruits and juices. Drinking slowly. What are tips for following this plan? Reading food labels To reduce fiber intake, look for food labels that say "whole," such as whole wheat or whole grain. Shopping Avoid food items that may have nuts or seeds. Avoid vegetables that may make you gassy or have a tough texture, such as broccoli, cauliflower, or corn. Cooking Cook foods thoroughly so they have a soft texture. Meal planning Make sure you include foods from all food groups to eat a balanced diet. Eat a variety of types of foods. Eat foods and drink beverages that do not cause you discomfort. These may include soups and broths with cooked meats, pasta, and vegetables. Lifestyle Sit up after meals, avoid tight clothing, and take time to eat and chew your food slowly. Ask your health care provider whether you should take dietary supplements. General information Mildly season your foods. Some seasonings, such as cayenne pepper, vinegar, or hot sauce, may cause irritation. The foods, beverages, or seasonings to avoid should be based on individual tolerance. What  foods should I eat? Fruits Canned or cooked fruit such as peaches, pears, or applesauce. Bananas. Vegetables Well-cooked vegetables. Canned or cooked vegetables such as carrots, green beans, beets, or spinach. Mashed or boiled potatoes. Grains  Hot cereals, such as cream of wheat and processed oatmeal. Rice. Bread, crackers, pasta, or tortillas made from refined white flour. Meats and other proteins  Eggs. Creamy peanut butter or other nut butters. Lean, well-cooked tender meats, such as beef, pork, chicken, or fish. Dairy Low-fat dairy products such as milk, cottage cheese, or yogurt. Beverages  Water. Herbal tea. Apple juice. Fats and oils Mild salad dressings. Canola or olive oil. Sweets and desserts Low-fat pudding, custard, or ice cream. Fruit gelatin. The items listed above may not be a complete list of foods and beverages you can eat. Contact a dietitian for more information. What foods should I avoid? Fruits Citrus fruits, such as oranges and grapefruit. Fruits with a stringy texture. Fruits that have lots of seeds, such as kiwi or strawberries. Dried fruits. Vegetables Raw, uncooked vegetables. Salads. Grains Whole grain breads, muffins, and cereals. Meats and other proteins Tough, fibrous meats. Highly seasoned meat such as corned beef, smoked meats, or fish. Processed high-fat meats such as brats, hot dogs, or sausage. Dairy Full-fat dairy foods such as ice cream and cheese. Beverages Caffeinated drinks. Alcohol. Seasonings and condiments Strongly flavored seasonings or condiments. Hot sauce. Salsa. Other foods Spicy foods. Fried or greasy foods. Sour foods, such as pickled or fermented foods like sauerkraut. Foods high in fiber. The items listed above may not be a complete list of foods and beverages you should   avoid. Contact a dietitian for more information. Summary A bland diet should be based on individual tolerance. It may consist of foods that are soft  textured and do not have a lot of fat, fiber, acid, or seasonings. A bland diet may be recommended because avoiding certain foods, beverages, or spices may make you feel better. This information is not intended to replace advice given to you by your health care provider. Make sure you discuss any questions you have with your health care provider. Document Revised: 07/15/2021 Document Reviewed: 07/15/2021 Elsevier Patient Education  2023 Elsevier Inc.  

## 2022-10-14 NOTE — Progress Notes (Signed)
I,Taejah Ohalloran,acting as a Education administrator for [name], FNP.,have documented all relevant documentation on the behalf of [name], FNP,as directed by  [name], FNP while in the presence of [name], [place].    Subjective:     Patient ID: [name] , male    DOB: [birth year] , 52 y.o.   MRN: [id number]   Chief Complaint  Patient presents with   Tinnitus    Left, comes and goes, denies pain, affecting sleep   Nausea    W/emesis yesterday, ate part of a sausage that wasn't fully cooked   Hypertension    HPI  Patient presents today for hypertension and diabetes follow up. Doing well with his medications. Patient also reports tinnitus in Left ear; nausea/emesis yesterday, reports ate part of an uncooked sausage.   Diabetes He presents for his follow-up diabetic visit. There are no hypoglycemic associated symptoms. Pertinent negatives for hypoglycemia include no dizziness or headaches. Pertinent negatives for diabetes include no blurred vision. There are no hypoglycemic complications. There are no diabetic complications. Risk factors for coronary artery disease include obesity and male sex. Current diabetic treatment includes oral agent (dual therapy). His weight is stable. He is following a generally healthy diet. When asked about meal planning, he reported none. He has not had a previous visit with a dietitian. He participates in exercise daily [place] - Friday). (Blood sugar is 95 - 125. )     Past Medical History:  Diagnosis Date   Allergic rhinitis    Anal fissure    Anxiety    Depression    Family history of adverse reaction to anesthesia    mother - hard awaken   Gastric polyp 03/08/2005   Benign   GERD (gastroesophageal reflux disease)    H. pylori infection    Headache(784.0)    Hyperlipidemia 06/04/2012   Hypertension    IBS (irritable bowel syndrome)    Insomnia    Plantar fasciitis    PONV (postoperative nausea and vomiting)    Pre-diabetes      Family  History  Problem Relation Age of Onset   Hypertension Mother    Diabetes Mother    Lung cancer Father    Colon cancer Neg Hx    Colon polyps Neg Hx    Crohn's disease Neg Hx    Esophageal cancer Neg Hx    Rectal cancer Neg Hx    Stomach cancer Neg Hx    Ulcerative colitis Neg Hx      Current Outpatient Medications:    Accu-Chek Softclix Lancets lancets, Use to check blood sugars once daily R73.03, Disp: 100 each, Rfl: 12   ALPRAZolam (XANAX) 1 MG tablet, TAKE 1 TABLET BY MOUTH EVERY NIGHT AT BEDTIME AS NEEDED FOR ANXIETY OR SLEEP, Disp: 30 tablet, Rfl: 1   atorvastatin (LIPITOR) 20 MG tablet, Take 1 tablet (20 mg total) by mouth daily., Disp: 90 tablet, Rfl: 1   Blood Glucose Monitoring Suppl (ACCU-CHEK GUIDE) w/Device KIT, Check blood sugars once daily R73.03, Disp: 1 kit, Rfl: 1   Glucagon (GVOKE HYPOPEN 2-PACK) 0.5 MG/0.1ML SOAJ, Inject 1 each into the skin as needed., Disp: 0.1 mL, Rfl: 3   glucose blood (ACCU-CHEK GUIDE) test strip, Use to check blood sugars once daily R73.03, Disp: 100 each, Rfl: 2   lisinopril-hydrochlorothiazide (ZESTORETIC) 20-25 MG tablet, Take 1 tablet by mouth daily., Disp: 90 tablet, Rfl: 1   metFORMIN (GLUCOPHAGE-XR) 500 MG 24 hr tablet, Take 1 tablet (500 mg total) by  mouth 2 (two) times daily., Disp: 180 tablet, Rfl: 1   omeprazole (PRILOSEC) 20 MG capsule, Take 1 capsule (20 mg total) by mouth daily., Disp: 90 capsule, Rfl: 1   ondansetron (ZOFRAN) 4 MG tablet, Take 1 tablet (4 mg total) by mouth daily as needed for nausea or vomiting., Disp: 30 tablet, Rfl: 1   Semaglutide, 1 MG/DOSE, (OZEMPIC, 1 MG/DOSE,) 4 MG/3ML SOPN, INJECT SUBCUTANEOUSLY 1 MG EVERY WEEK, Disp: 9 mL, Rfl: 1   traMADol (ULTRAM) 50 MG tablet, Take 50 mg by mouth at bedtime., Disp: , Rfl:    azelastine (ASTELIN) 0.1 % nasal spray, Place 2 sprays into both nostrils 2 (two) times daily. Use in each nostril as directed, Disp: 30 mL, Rfl: 6   Allergies  Allergen Reactions   Hydrocodone      Headache, hypersensitivity     Review of Systems  Constitutional: Negative.   HENT:  Positive for tinnitus.   Eyes:  Negative for blurred vision.  Respiratory: Negative.    Cardiovascular: Negative.   Gastrointestinal:  Positive for nausea and vomiting.  Skin:  Negative for wound.  Neurological: Negative.  Negative for dizziness and headaches.  Psychiatric/Behavioral: Negative.       Today's Vitals   10/14/22 0823  BP: 134/68  Pulse: 87  Temp: 97.7 F (36.5 C)  TempSrc: Oral  SpO2: 98%  Weight: 245 lb (111.1 kg)  Height: 5' 11"$  (1.803 m)   Body mass index is 34.17 kg/m.   Objective:  Physical Exam Vitals reviewed.  Constitutional:      General: He is not in acute distress.    Appearance: Normal appearance.  Cardiovascular:     Rate and Rhythm: Normal rate and regular rhythm.     Pulses: Normal pulses.     Heart sounds: Normal heart sounds. No murmur heard. Pulmonary:     Effort: Pulmonary effort is normal. No respiratory distress.     Breath sounds: Normal breath sounds. No wheezing.  Skin:    General: Skin is warm.     Capillary Refill: Capillary refill takes less than 2 seconds.  Neurological:     General: No focal deficit present.     Mental Status: He is alert and oriented to person, place, and time.     Cranial Nerves: No cranial nerve deficit.     Motor: No weakness.  Psychiatric:        Mood and Affect: Mood normal.        Behavior: Behavior normal.        Thought Content: Thought content normal.        Judgment: Judgment normal.         Assessment And Plan:     1. Diabetes mellitus type 2 in obese [facility]) Comments: Controlled and within normal limits. Continue current medications. - Hemoglobin A1c - CMP14+EGFR - Microalbumin / Creatinine Urine Ratio  2. Essential hypertension Comments: Blood pressure is fairly controlled, encouraged to focus on lifestyle modifications - CMP14+EGFR  3. Mixed hyperlipidemia Comments: Cholesterol levels  are improved. Continue current medications - Lipid panel  4. Nausea Comments: He ate uncooked sausage and is now having nausea and unable to keep any food down, will provide Zofran as needed. Encouraged bland diet. - ondansetron (ZOFRAN) 4 MG tablet; Take 1 tablet (4 mg total) by mouth daily as needed for nausea or vomiting.  Dispense: 30 tablet; Refill: 1  5. Seasonal allergies - azelastine (ASTELIN) 0.1 % nasal spray; Place 2 sprays into both nostrils 2 (  two) times daily. Use in each nostril as directed  Dispense: 30 mL; Refill: 6  6. Tinnitus of both ears Comments: Continue current medications.    Patient was given opportunity to ask questions. Patient verbalized understanding of the plan and was able to repeat key elements of the plan. All questions were answered to their satisfaction.  [name], FNP   I, [name], FNP, have reviewed all documentation for this visit. The documentation on 10/14/22 for the exam, diagnosis, procedures, and orders are all accurate and complete.   IF YOU HAVE BEEN REFERRED TO A SPECIALIST, IT MAY TAKE 1-2 WEEKS TO SCHEDULE/PROCESS THE REFERRAL. IF YOU HAVE NOT HEARD FROM US/SPECIALIST IN TWO WEEKS, PLEASE GIVE [place] A CALL AT [phone].   THE PATIENT IS ENCOURAGED TO PRACTICE SOCIAL DISTANCING DUE TO THE COVID-19 PANDEMIC.

## 2022-10-15 LAB — CMP14+EGFR
ALT: 26 IU/L (ref 0–44)
AST: 22 IU/L (ref 0–40)
Albumin/Globulin Ratio: 2.3 — ABNORMAL HIGH (ref 1.2–2.2)
Albumin: 5.4 g/dL — ABNORMAL HIGH (ref 3.8–4.9)
Alkaline Phosphatase: 69 IU/L (ref 44–121)
BUN/Creatinine Ratio: 18 (ref 9–20)
BUN: 16 mg/dL (ref 6–24)
Bilirubin Total: 0.7 mg/dL (ref 0.0–1.2)
CO2: 25 mmol/L (ref 20–29)
Calcium: 10 mg/dL (ref 8.7–10.2)
Chloride: 93 mmol/L — ABNORMAL LOW (ref 96–106)
Creatinine, Ser: 0.89 mg/dL (ref 0.76–1.27)
Globulin, Total: 2.3 g/dL (ref 1.5–4.5)
Glucose: 88 mg/dL (ref 70–99)
Potassium: 3.8 mmol/L (ref 3.5–5.2)
Sodium: 135 mmol/L (ref 134–144)
Total Protein: 7.7 g/dL (ref 6.0–8.5)
eGFR: 103 mL/min/{1.73_m2} (ref >59)

## 2022-10-15 LAB — HEMOGLOBIN A1C
Est. average glucose Bld gHb Est-mCnc: 120 mg/dL
Hgb A1c MFr Bld: 5.8 % — ABNORMAL HIGH (ref 4.8–5.6)

## 2022-10-15 LAB — LIPID PANEL
Chol/HDL Ratio: 2.3 ratio (ref 0.0–5.0)
Cholesterol, Total: 85 mg/dL — ABNORMAL LOW (ref 100–199)
HDL: 37 mg/dL — ABNORMAL LOW (ref >39)
LDL Chol Calc (NIH): 34 mg/dL (ref 0–99)
Triglycerides: 59 mg/dL (ref 0–149)
VLDL Cholesterol Cal: 14 mg/dL (ref 5–40)

## 2022-10-16 LAB — MICROALBUMIN / CREATININE URINE RATIO
Creatinine, Urine: 148.4 mg/dL
Microalb/Creat Ratio: 10 mg/g creat (ref 0–29)
Microalbumin, Urine: 14.8 ug/mL

## 2022-10-20 MED ORDER — ATORVASTATIN CALCIUM 10 MG PO TABS: 10.0000 mg | ORAL_TABLET | Freq: Every day | ORAL | 1 refills | Status: DC

## 2022-10-30 ENCOUNTER — Telehealth: Payer: Self-pay

## 2022-10-30 NOTE — Telephone Encounter (Signed)
Patient called to report increased urination at night, states he is getting up 2-3 times each night. He also states he saw the dermatologist and was told he has eczema, not psoriasis. He also states the Astelin is not working for him.  Please advise, thanks!

## 2022-10-31 NOTE — Telephone Encounter (Signed)
He will need an office visit for the urination frequency. Okay about the Dermatologist. If he wants a short course of steroids I will send but he needs to know this can cause his blood sugar to increase and make sure to see if he is taking an over the counter antihistamine daily.

## 2022-10-31 NOTE — Telephone Encounter (Signed)
Spoke with patient and advised. He will call back if he would like to scheduled OV. Nothing further needed at this time.

## 2022-11-05 ENCOUNTER — Telehealth: Payer: Self-pay

## 2022-11-05 DIAGNOSIS — R350 Frequency of micturition: Secondary | ICD-10-CM

## 2022-11-05 NOTE — Telephone Encounter (Signed)
Patient aware and given office hours.

## 2022-11-05 NOTE — Telephone Encounter (Signed)
Patient states you had mentioned he could come by and drop off a urine sample. Patient reports increased urinary frequency, denies any other symptoms.  Please advise, thank you!

## 2022-11-10 ENCOUNTER — Other Ambulatory Visit: Payer: Self-pay | Admitting: Nurse Practitioner

## 2022-11-10 ENCOUNTER — Telehealth: Payer: Self-pay

## 2022-11-10 DIAGNOSIS — H9313 Tinnitus, bilateral: Secondary | ICD-10-CM

## 2022-11-10 MED ORDER — ALPRAZOLAM 1 MG PO TABS: ORAL_TABLET | ORAL | 2 refills | Status: DC

## 2022-11-10 NOTE — Telephone Encounter (Signed)
He can come in to be evaluated for sleep or he can try over the counter melatonin, aswaghanda or calm supplement

## 2022-11-10 NOTE — Telephone Encounter (Signed)
Patient called to report he is having issues with insomnia. He would like to see if you could prescribe him something to help him sleep.  Please advise, thanks!

## 2022-11-10 NOTE — Telephone Encounter (Signed)
Per patient he has tried Melatonin and Ashwaghanda with no success. He will check into other OTC options.  Patient would like to schedule OV, several dates/times offered but none work for his schedule. Advised patient to check availability via MyChart or he can call office directly to schedule.   He would also like to request refill on Alprazolam. This has already been sent to pharmacy.

## 2022-11-27 ENCOUNTER — Telehealth: Payer: Self-pay

## 2022-11-27 ENCOUNTER — Other Ambulatory Visit: Payer: Self-pay

## 2022-11-27 NOTE — Telephone Encounter (Signed)
Received fax from [facility] with APPROVAL for Ozempic  Approval ends 11/27/23

## 2022-11-27 NOTE — Telephone Encounter (Signed)
Per pharmacy Ozempic requires PA.  PA started via [facility] Key: [id number]

## 2022-12-03 ENCOUNTER — Telehealth: Payer: Self-pay

## 2022-12-03 NOTE — Telephone Encounter (Signed)
Patient called to report he tested positive for COVID on Monday. He states his symptoms started on Sunday. Advised patient we do not have any openings due to this being a holiday week, recommended patient schedule a virtual UC visit through his MyChart, they can also provide letter for his employer. Patient states he will do this.

## 2022-12-06 ENCOUNTER — Emergency Department (HOSPITAL_COMMUNITY)
Admission: EM | Admit: 2022-12-06 | Discharge: 2022-12-07 | Disposition: A | Discharge status: Home / Self Care | Payer: 59 | Attending: Emergency Medicine | Admitting: Emergency Medicine

## 2022-12-06 ENCOUNTER — Emergency Department (HOSPITAL_COMMUNITY): Payer: 59

## 2022-12-06 DIAGNOSIS — R000 Tachycardia, unspecified: Secondary | ICD-10-CM | POA: Diagnosis not present

## 2022-12-06 DIAGNOSIS — R5381 Other malaise: Secondary | ICD-10-CM | POA: Diagnosis not present

## 2022-12-06 DIAGNOSIS — R0789 Other chest pain: Secondary | ICD-10-CM

## 2022-12-06 DIAGNOSIS — R112 Nausea with vomiting, unspecified: Secondary | ICD-10-CM

## 2022-12-06 LAB — URINALYSIS, ROUTINE W REFLEX MICROSCOPIC
Bilirubin Urine: NEGATIVE
Glucose, UA: NEGATIVE mg/dL
Hgb urine dipstick: NEGATIVE
Ketones, ur: 5 mg/dL — AB
Leukocytes,Ua: NEGATIVE
Nitrite: NEGATIVE
Protein, ur: NEGATIVE mg/dL
Specific Gravity, Urine: 1.017 (ref 1.005–1.030)
pH: 6 (ref 5.0–8.0)

## 2022-12-06 LAB — CBC
HCT: 41.9 % (ref 39.0–52.0)
Hemoglobin: 15.2 g/dL (ref 13.0–17.0)
MCH: 29.6 pg (ref 26.0–34.0)
MCHC: 36.3 g/dL — ABNORMAL HIGH (ref 30.0–36.0)
MCV: 81.5 fL (ref 80.0–100.0)
Platelets: 247 10*3/uL (ref 150–400)
RBC: 5.14 MIL/uL (ref 4.22–5.81)
RDW: 12.4 % (ref 11.5–15.5)
WBC: 6.9 10*3/uL (ref 4.0–10.5)
nRBC: 0 % (ref 0.0–0.2)

## 2022-12-06 LAB — BASIC METABOLIC PANEL
Anion gap: 18 — ABNORMAL HIGH (ref 5–15)
BUN: 20 mg/dL (ref 6–20)
CO2: 21 mmol/L — ABNORMAL LOW (ref 22–32)
Calcium: 9.4 mg/dL (ref 8.9–10.3)
Chloride: 96 mmol/L — ABNORMAL LOW (ref 98–111)
Creatinine, Ser: 0.87 mg/dL (ref 0.61–1.24)
GFR, Estimated: >60 mL/min (ref >60)
Glucose, Bld: 99 mg/dL (ref 70–99)
Potassium: 3.1 mmol/L — ABNORMAL LOW (ref 3.5–5.1)
Sodium: 135 mmol/L (ref 135–145)

## 2022-12-06 LAB — TROPONIN I (HIGH SENSITIVITY)
Troponin I (High Sensitivity): 3 ng/L (ref <18)
Troponin I (High Sensitivity): 3 ng/L (ref <18)

## 2022-12-06 MED ORDER — ONDANSETRON 4 MG PO TBDP
4.0000 mg | ORAL_TABLET | Freq: Once | ORAL | Status: AC
  Administered 2022-12-06: 4 mg via ORAL
  Filled 2022-12-06: qty 1

## 2022-12-06 NOTE — ED Triage Notes (Signed)
Pt tested positive for covid Monday. Symptoms started Sunday. Today feeling hotness in chest and left shoulder and having nausea. Vomited x 3.

## 2022-12-06 NOTE — ED Notes (Signed)
Pt reports his nausea has improved. Water given

## 2022-12-07 MED ORDER — ONDANSETRON 4 MG PO TBDP: ORAL_TABLET | ORAL | 0 refills | Status: DC

## 2022-12-07 MED ORDER — SODIUM CHLORIDE 0.9 % IV BOLUS
1000.0000 mL | Freq: Once | INTRAVENOUS | Status: AC
  Administered 2022-12-07: 1000 mL via INTRAVENOUS

## 2022-12-07 NOTE — ED Provider Notes (Signed)
[facility] Provider Note   CSN: [id number] Arrival date & time: 12/06/22  2001     History {Add pertinent medical, surgical, social history, OB history to HPI:1} Chief Complaint  Patient presents with   Chest Pain    [name] is a 52 y.o. male.  Patient is here with multiple problems.  He is here tonight because he developed chest pain but he has been sick for a week.  He tested positive for COVID 1 week ago.  He has had malaise, generalized aches and pains since then.  He felt some flushing and hotness today then became acutely nauseated.  He vomited multiple times.  He feels dehydrated now is having aches and pains in the muscles of his thighs and arms.  Chest pain has resolved.       Home Medications Prior to Admission medications   Medication Sig Start Date End Date Taking? Authorizing Provider  Accu-Chek Softclix Lancets lancets Use to check blood sugars once daily R73.03 06/18/21   [name], FNP  ALPRAZolam Duanne Moron) 1 MG tablet TAKE 1 TABLET BY MOUTH EVERY NIGHT AT BEDTIME AS NEEDED FOR ANXIETY OR SLEEP 11/10/22   [name], FNP  atorvastatin (LIPITOR) 10 MG tablet Take 1 tablet (10 mg total) by mouth daily. 10/20/22   [name], FNP  augmented betamethasone dipropionate (DIPROLENE-AF) 0.05 % cream Apply topically. 11/02/22   [provider]  azelastine (ASTELIN) 0.1 % nasal spray Place 2 sprays into both nostrils 2 (two) times daily. Use in each nostril as directed 10/14/22   [name], FNP  Blood Glucose Monitoring Suppl (ACCU-CHEK GUIDE) w/Device KIT Check blood sugars once daily R73.03 06/18/21   [name], FNP  desonide (DESOWEN) 0.05 % cream Apply topically 2 (two) times daily as needed. 11/02/22   [provider]  Glucagon (GVOKE HYPOPEN 2-PACK) 0.5 MG/0.1ML SOAJ Inject 1 each into the skin as needed. 06/05/22   [name], FNP  glucose blood (ACCU-CHEK GUIDE) test strip Use to check  blood sugars once daily R73.03 06/05/22   [name], FNP  lisinopril-hydrochlorothiazide (ZESTORETIC) 20-25 MG tablet Take 1 tablet by mouth daily. 06/05/22   [name], FNP  metFORMIN (GLUCOPHAGE-XR) 500 MG 24 hr tablet Take 1 tablet (500 mg total) by mouth 2 (two) times daily. 06/05/22   [name], FNP  omeprazole (PRILOSEC) 20 MG capsule Take 1 capsule (20 mg total) by mouth daily. 06/05/22   [name], FNP  ondansetron (ZOFRAN) 4 MG tablet Take 1 tablet (4 mg total) by mouth daily as needed for nausea or vomiting. 10/14/22 10/14/23  [name], FNP  Semaglutide, 1 MG/DOSE, (OZEMPIC, 1 MG/DOSE,) 4 MG/3ML SOPN INJECT SUBCUTANEOUSLY 1 MG EVERY WEEK 06/05/22   [name], FNP  traMADol (ULTRAM) 50 MG tablet Take 50 mg by mouth at bedtime. 04/01/22   [provider]      Allergies    Hydrocodone    Review of Systems   Review of Systems  Physical Exam Updated Vital Signs BP 139/82 (BP Location: Right Arm)   Pulse 88   Temp 98.2 F (36.8 C) (Oral)   Resp 18   SpO2 96%  Physical Exam Vitals and nursing note reviewed.  Constitutional:      General: He is not in acute distress.    Appearance: He is well-developed.  HENT:     Head: Normocephalic and atraumatic.     Mouth/Throat:     Mouth: Mucous membranes are  moist.  Eyes:     General: Vision grossly intact. Gaze aligned appropriately.     Extraocular Movements: Extraocular movements intact.     Conjunctiva/sclera: Conjunctivae normal.  Cardiovascular:     Rate and Rhythm: Normal rate and regular rhythm.     Pulses: Normal pulses.     Heart sounds: Normal heart sounds, S1 normal and S2 normal. No murmur heard.    No friction rub. No gallop.  Pulmonary:     Effort: Pulmonary effort is normal. No respiratory distress.     Breath sounds: Normal breath sounds.  Abdominal:     Palpations: Abdomen is soft.     Tenderness: There is no abdominal tenderness. There is no guarding or rebound.     Hernia: No hernia  is present.  Musculoskeletal:        General: No swelling.     Cervical back: Full passive range of motion without pain, normal range of motion and neck supple. No pain with movement, spinous process tenderness or muscular tenderness. Normal range of motion.     Right lower leg: No edema.     Left lower leg: No edema.  Skin:    General: Skin is warm and dry.     Capillary Refill: Capillary refill takes less than 2 seconds.     Findings: No ecchymosis, erythema, lesion or wound.  Neurological:     Mental Status: He is alert and oriented to person, place, and time.     GCS: GCS eye subscore is 4. GCS verbal subscore is 5. GCS motor subscore is 6.     Cranial Nerves: Cranial nerves 2-12 are intact.     Sensory: Sensation is intact.     Motor: Motor function is intact. No weakness or abnormal muscle tone.     Coordination: Coordination is intact.  Psychiatric:        Mood and Affect: Mood normal.        Speech: Speech normal.        Behavior: Behavior normal.     ED Results / Procedures / Treatments   Labs (all labs ordered are listed, but only abnormal results are displayed) Labs Reviewed  BASIC METABOLIC PANEL - Abnormal; Notable for the following components:      Result Value   Potassium 3.1 (*)    Chloride 96 (*)    CO2 21 (*)    Anion gap 18 (*)    All other components within normal limits  CBC - Abnormal; Notable for the following components:   MCHC 36.3 (*)    All other components within normal limits  URINALYSIS, ROUTINE W REFLEX MICROSCOPIC - Abnormal; Notable for the following components:   Ketones, ur 5 (*)    All other components within normal limits  TROPONIN I (HIGH SENSITIVITY)  TROPONIN I (HIGH SENSITIVITY)    EKG EKG Interpretation  Date/Time:  Saturday December 06 2022 20:05:02 EDT Ventricular Rate:  106 PR Interval:  156 QRS Duration: 74 QT Interval:  356 QTC Calculation: 472 R Axis:   35 Text Interpretation: Sinus tachycardia Otherwise normal ECG  When compared with ECG of 14-Sep-2015 15:11, PREVIOUS ECG IS PRESENT Confirmed by Orpah Greek [id number]) on 12/06/2022 11:47:52 PM  Radiology DG Chest 2 View  Result Date: 12/06/2022 CLINICAL DATA:  COVID positive, chest pain EXAM: CHEST - 2 VIEW COMPARISON:  02/21/2008 FINDINGS: The heart size and mediastinal contours are within normal limits. Both lungs are clear. The visualized skeletal structures are unremarkable. IMPRESSION:  Normal study. Electronically Signed   By: Rolm Baptise M.D.   On: 12/06/2022 21:04    Procedures Procedures  {Document cardiac monitor, telemetry assessment procedure when appropriate:1}  Medications Ordered in ED Medications  sodium chloride 0.9 % bolus 1,000 mL (has no administration in time range)  ondansetron (ZOFRAN-ODT) disintegrating tablet 4 mg (4 mg Oral Given 12/06/22 2042)    ED Course/ Medical Decision Making/ A&P   {   Click here for ABCD2, HEART and other calculatorsREFRESH Note before signing :1}                          Medical Decision Making Amount and/or Complexity of Data Reviewed Labs: ordered. Radiology: ordered.  Risk Prescription drug management.   ***  {Document critical care time when appropriate:1} {Document review of labs and clinical decision tools ie heart score, Chads2Vasc2 etc:1}  {Document your independent review of radiology images, and any outside records:1} {Document your discussion with family members, caretakers, and with consultants:1} {Document social determinants of health affecting pt's care:1} {Document your decision making why or why not admission, treatments were needed:1} Final Clinical Impression(s) / ED Diagnoses Final diagnoses:  None    Rx / DC Orders ED Discharge Orders     None

## 2022-12-09 ENCOUNTER — Other Ambulatory Visit: Payer: Self-pay | Admitting: Nurse Practitioner

## 2022-12-09 DIAGNOSIS — E669 Obesity, unspecified: Secondary | ICD-10-CM

## 2022-12-10 ENCOUNTER — Encounter: Payer: Self-pay | Admitting: Nurse Practitioner

## 2022-12-10 ENCOUNTER — Ambulatory Visit: Payer: 59 | Admitting: Nurse Practitioner

## 2022-12-10 VITALS — BP 110/72 | HR 74 | Temp 98.0°F | Ht 71.0 in | Wt 242.0 lb

## 2022-12-10 DIAGNOSIS — Z8616 Personal history of COVID-19: Secondary | ICD-10-CM | POA: Diagnosis not present

## 2022-12-10 DIAGNOSIS — Z6833 Body mass index (BMI) 33.0-33.9, adult: Secondary | ICD-10-CM | POA: Diagnosis not present

## 2022-12-10 DIAGNOSIS — R351 Nocturia: Secondary | ICD-10-CM | POA: Insufficient documentation

## 2022-12-10 DIAGNOSIS — G4709 Other insomnia: Secondary | ICD-10-CM | POA: Diagnosis not present

## 2022-12-10 MED ORDER — TRAZODONE HCL 50 MG PO TABS: 50.0000 mg | ORAL_TABLET | Freq: Every day | ORAL | 1 refills | Status: DC

## 2022-12-10 NOTE — Progress Notes (Signed)
I,Sheena H Holbrook,acting as a Education administrator for [name], FNP.,have documented all relevant documentation on the behalf of [name], FNP,as directed by  [name], FNP while in the presence of [name], [place].    Subjective:     Patient ID: [name] , male    DOB: 1971-03-08 , 52 y.o.   MRN: [id number]   Chief Complaint  Patient presents with   Insomnia    HPI  Patient presents today for concern about insomnia. Patient reports having difficulty falling asleep and staying asleep. He has tried Melatonin but reports needing to wake twice a night to urinate, which his wife told him was a side effect of the supplement.  He had taken ambien in the past but stopped taking on his own due to concerns of sleep walking.   Patient did have sleep study about 5 years ago and it was normal. Patient recently had COVID where he went to ER due to feeling like he can not breath and atypical chest pain.    Insomnia The current episode started more than one month. The onset quality is gradual. The problem occurs intermittently (4-5 days a week). The symptoms are aggravated by anxiety. Types of beverages you drink: soda (drink diet soda every other day). Past treatments include medication (melatonin for 2-3 months, he feels since stopping the melatonin not using bathroom as much. He has also taken ashwaganda. [place] tea). Typical bedtime:  10-11 P.M..  How long after going to bed to you fall asleep: 15-30 minutes.       Past Medical History:  Diagnosis Date   Allergic rhinitis    Anal fissure    Anxiety    Depression    Family history of adverse reaction to anesthesia    mother - hard awaken   Gastric polyp 03/08/2005   Benign   GERD (gastroesophageal reflux disease)    H. pylori infection    Headache(784.0)    Hyperlipidemia 06/04/2012   Hypertension    IBS (irritable bowel syndrome)    Insomnia    Plantar fasciitis    PONV (postoperative nausea and vomiting)    Pre-diabetes       Family History  Problem Relation Age of Onset   Hypertension Mother    Diabetes Mother    Lung cancer Father    Colon cancer Neg Hx    Colon polyps Neg Hx    Crohn's disease Neg Hx    Esophageal cancer Neg Hx    Rectal cancer Neg Hx    Stomach cancer Neg Hx    Ulcerative colitis Neg Hx      Current Outpatient Medications:    Accu-Chek Softclix Lancets lancets, Use to check blood sugars once daily R73.03, Disp: 100 each, Rfl: 12   ALPRAZolam (XANAX) 1 MG tablet, TAKE 1 TABLET BY MOUTH EVERY NIGHT AT BEDTIME AS NEEDED FOR ANXIETY OR SLEEP, Disp: 30 tablet, Rfl: 2   atorvastatin (LIPITOR) 10 MG tablet, Take 1 tablet (10 mg total) by mouth daily., Disp: 90 tablet, Rfl: 1   augmented betamethasone dipropionate (DIPROLENE-AF) 0.05 % cream, Apply topically., Disp: , Rfl:    azelastine (ASTELIN) 0.1 % nasal spray, Place 2 sprays into both nostrils 2 (two) times daily. Use in each nostril as directed, Disp: 30 mL, Rfl: 6   Blood Glucose Monitoring Suppl (ACCU-CHEK GUIDE) w/Device KIT, Check blood sugars once daily R73.03, Disp: 1 kit, Rfl: 1   desonide (DESOWEN) 0.05 % cream, Apply topically 2 (two) times  daily as needed., Disp: , Rfl:    Glucagon (GVOKE HYPOPEN 2-PACK) 0.5 MG/0.1ML SOAJ, Inject 1 each into the skin as needed., Disp: 0.1 mL, Rfl: 3   glucose blood (ACCU-CHEK GUIDE) test strip, Use to check blood sugars once daily R73.03, Disp: 100 each, Rfl: 2   lisinopril-hydrochlorothiazide (ZESTORETIC) 20-25 MG tablet, Take 1 tablet by mouth daily., Disp: 90 tablet, Rfl: 1   metFORMIN (GLUCOPHAGE-XR) 500 MG 24 hr tablet, Take 1 tablet (500 mg total) by mouth 2 (two) times daily., Disp: 180 tablet, Rfl: 1   omeprazole (PRILOSEC) 20 MG capsule, Take 1 capsule (20 mg total) by mouth daily., Disp: 90 capsule, Rfl: 1   ondansetron (ZOFRAN) 4 MG tablet, Take 1 tablet (4 mg total) by mouth daily as needed for nausea or vomiting., Disp: 30 tablet, Rfl: 1   ondansetron (ZOFRAN-ODT) 4 MG  disintegrating tablet, 4mg  ODT q4 hours prn nausea/vomit, Disp: 10 tablet, Rfl: 0   Semaglutide, 1 MG/DOSE, (OZEMPIC, 1 MG/DOSE,) 4 MG/3ML SOPN, INJECT SUBCUTANEOUSLY 1 MG EVERY WEEK, Disp: 9 mL, Rfl: 1   traZODone (DESYREL) 50 MG tablet, Take 1 tablet (50 mg total) by mouth at bedtime., Disp: 30 tablet, Rfl: 1   Allergies  Allergen Reactions   Hydrocodone     Headache, hypersensitivity     Review of Systems  Constitutional: Negative.   HENT:  Positive for tinnitus.   Respiratory: Negative.    Cardiovascular: Negative.   Gastrointestinal:  Negative for nausea and vomiting.  Skin:  Negative for wound.  Neurological: Negative.  Negative for dizziness and headaches.  Psychiatric/Behavioral:  The patient has insomnia.      Today's Vitals   12/10/22 0831  BP: 110/72  Pulse: 74  Temp: 98 F (36.7 C)  TempSrc: Oral  SpO2: 98%  Weight: 242 lb (109.8 kg)  Height: 5\' 11"  (1.803 m)   Body mass index is 33.75 kg/m.   Objective:  Physical Exam Vitals reviewed.  Constitutional:      General: He is not in acute distress.    Appearance: Normal appearance.  Cardiovascular:     Rate and Rhythm: Normal rate and regular rhythm.     Pulses: Normal pulses.     Heart sounds: Normal heart sounds. No murmur heard. Pulmonary:     Effort: Pulmonary effort is normal. No respiratory distress.     Breath sounds: Normal breath sounds. No wheezing.  Skin:    General: Skin is warm.     Capillary Refill: Capillary refill takes less than 2 seconds.  Neurological:     General: No focal deficit present.     Mental Status: He is alert and oriented to person, place, and time.     Cranial Nerves: No cranial nerve deficit.     Motor: No weakness.  Psychiatric:        Mood and Affect: Mood normal.        Behavior: Behavior normal.        Thought Content: Thought content normal.        Judgment: Judgment normal.         Assessment And Plan:     1. Other insomnia Comments: Long term issues  with insomnia, tried melatonin, aswhaganda and ambien without benefit. Will treat with Trazodone. Advised to not take his Xanax and trazodone together due to increase risk for sleepiness - traZODone (DESYREL) 50 MG tablet; Take 1 tablet (50 mg total) by mouth at bedtime.  Dispense: 30 tablet; Refill: 1  2. Nocturia  Comments: Having intermittent episodes of nocturia which he thinks improved after taking melatonin but due to age and no recent PSA levels will check - PSA  3. Body mass index (BMI) 33.0-33.9, adult She is encouraged to strive for BMI less than 30 to decrease cardiac risk. Advised to aim for at least 150 minutes of exercise per week.  4. History of COVID-19 Comments: Seen at ER for atypical chest pain and difficulty breathing he is doing better just has a little cough     Patient was given opportunity to ask questions. Patient verbalized understanding of the plan and was able to repeat key elements of the plan. All questions were answered to their satisfaction.  [name], FNP   I, [name], FNP, have reviewed all documentation for this visit. The documentation on 12/10/22 for the exam, diagnosis, procedures, and orders are all accurate and complete.   IF YOU HAVE BEEN REFERRED TO A SPECIALIST, IT MAY TAKE 1-2 WEEKS TO SCHEDULE/PROCESS THE REFERRAL. IF YOU HAVE NOT HEARD FROM US/SPECIALIST IN TWO WEEKS, PLEASE GIVE [place] A CALL AT [phone].   THE PATIENT IS ENCOURAGED TO PRACTICE SOCIAL DISTANCING DUE TO THE COVID-19 PANDEMIC.

## 2022-12-11 ENCOUNTER — Telehealth: Payer: Self-pay

## 2022-12-11 LAB — PSA: Prostate Specific Ag, Serum: 0.7 ng/mL (ref 0.0–4.0)

## 2022-12-11 NOTE — Telephone Encounter (Signed)
Patient called to report the Trazodone did not work. He states he took one tablet, could not sleep, took another tablet 3 hours later. He reports he was able to sleep some, but ended up taking a Xanax to help. He states he feels he cannot sleep due to the tinnitus, and that the Xanax helps the tinnitus go away. Advised patient that provider will likely not prescribe Xanax as a long-term sleep aid. Per provider patient can consider sleep specialist or return to ENT.   Patient will try Trazodone for one week and update [place] on how he is doing. Patient aware next step would likely be referral to sleep specialist. Provider aware of conversation.

## 2022-12-25 ENCOUNTER — Telehealth: Payer: Self-pay

## 2022-12-25 NOTE — Telephone Encounter (Signed)
Patient called to advise the Trazodone has not been working to help him sleep. He states he does not want to try anything else at this point. Medication removed from med list.

## 2022-12-30 ENCOUNTER — Other Ambulatory Visit: Payer: Self-pay | Admitting: Nurse Practitioner

## 2022-12-30 DIAGNOSIS — I10 Essential (primary) hypertension: Secondary | ICD-10-CM

## 2023-01-01 ENCOUNTER — Telehealth: Payer: Self-pay

## 2023-01-01 NOTE — Telephone Encounter (Signed)
Patient called to report he is going on a cruise to [place] and would like a rx for something for nausea/sea sickness.   Please advise, thanks!

## 2023-01-08 ENCOUNTER — Other Ambulatory Visit: Payer: Self-pay | Admitting: Nurse Practitioner

## 2023-01-08 MED ORDER — SCOPOLAMINE 1 MG/3DAYS TD PT72: 1.0000 | MEDICATED_PATCH | TRANSDERMAL | 0 refills | Status: DC

## 2023-01-20 ENCOUNTER — Other Ambulatory Visit: Payer: Self-pay | Admitting: Nurse Practitioner

## 2023-01-20 DIAGNOSIS — G4709 Other insomnia: Secondary | ICD-10-CM

## 2023-01-20 MED ORDER — ESZOPICLONE 2 MG PO TABS: 2.0000 mg | ORAL_TABLET | Freq: Every day | ORAL | 1 refills | Status: DC

## 2023-01-21 ENCOUNTER — Other Ambulatory Visit: Payer: Self-pay

## 2023-01-21 ENCOUNTER — Telehealth: Payer: Self-pay

## 2023-01-21 MED ORDER — ATORVASTATIN CALCIUM 10 MG PO TABS: 10.0000 mg | ORAL_TABLET | Freq: Every day | ORAL | 1 refills | Status: DC

## 2023-01-22 ENCOUNTER — Ambulatory Visit: Payer: 59 | Admitting: Nurse Practitioner

## 2023-01-23 NOTE — Telephone Encounter (Signed)
ERROR

## 2023-01-27 ENCOUNTER — Other Ambulatory Visit: Payer: Self-pay

## 2023-01-27 MED ORDER — ATORVASTATIN CALCIUM 10 MG PO TABS: 10.0000 mg | ORAL_TABLET | Freq: Every day | ORAL | 2 refills | Status: DC

## 2023-02-01 ENCOUNTER — Other Ambulatory Visit: Payer: Self-pay | Admitting: Nurse Practitioner

## 2023-02-01 DIAGNOSIS — E1169 Type 2 diabetes mellitus with other specified complication: Secondary | ICD-10-CM

## 2023-02-10 ENCOUNTER — Other Ambulatory Visit: Payer: Self-pay | Admitting: Nurse Practitioner

## 2023-02-10 DIAGNOSIS — H9313 Tinnitus, bilateral: Secondary | ICD-10-CM

## 2023-02-11 ENCOUNTER — Other Ambulatory Visit: Payer: Self-pay

## 2023-02-12 ENCOUNTER — Other Ambulatory Visit: Payer: Self-pay | Admitting: Nurse Practitioner

## 2023-02-12 DIAGNOSIS — H9313 Tinnitus, bilateral: Secondary | ICD-10-CM

## 2023-02-12 MED ORDER — ALPRAZOLAM 1 MG PO TABS: ORAL_TABLET | ORAL | 2 refills | Status: DC

## 2023-02-12 NOTE — Progress Notes (Signed)
I have reviewed the PDMP during this encounter. Refill for Alprazolam sent to pharmacy.

## 2023-02-24 ENCOUNTER — Encounter: Payer: Self-pay | Admitting: Nurse Practitioner

## 2023-02-24 ENCOUNTER — Ambulatory Visit: Payer: 59 | Admitting: Nurse Practitioner

## 2023-02-24 VITALS — BP 110/70 | HR 73 | Temp 98.5°F | Ht 71.0 in | Wt 243.2 lb

## 2023-02-24 DIAGNOSIS — E1169 Type 2 diabetes mellitus with other specified complication: Secondary | ICD-10-CM

## 2023-02-24 DIAGNOSIS — H9313 Tinnitus, bilateral: Secondary | ICD-10-CM | POA: Diagnosis not present

## 2023-02-24 DIAGNOSIS — Z79899 Other long term (current) drug therapy: Secondary | ICD-10-CM

## 2023-02-24 DIAGNOSIS — G4709 Other insomnia: Secondary | ICD-10-CM | POA: Diagnosis not present

## 2023-02-24 DIAGNOSIS — E782 Mixed hyperlipidemia: Secondary | ICD-10-CM

## 2023-02-24 DIAGNOSIS — E669 Obesity, unspecified: Secondary | ICD-10-CM

## 2023-02-24 DIAGNOSIS — Z6833 Body mass index (BMI) 33.0-33.9, adult: Secondary | ICD-10-CM

## 2023-02-24 MED ORDER — ALPRAZOLAM 1 MG PO TABS: ORAL_TABLET | ORAL | 5 refills | Status: DC

## 2023-02-24 MED ORDER — ACCU-CHEK GUIDE VI STRP: ORAL_STRIP | 2 refills | Status: AC

## 2023-02-24 NOTE — Progress Notes (Addendum)
I,Seynabou Fults,acting as a Neurosurgeon for Arnette Felts, FNP.,have documented all relevant documentation on the behalf of Arnette Felts, FNP,as directed by  Arnette Felts, FNP while in the presence of Arnette Felts, FNP.  Subjective:  Patient ID: [name] , male    DOB: 17-Feb-1971 , 52 y.o.   MRN: [id number]  Chief Complaint  Patient presents with   Depression   Insomnia   Anxiety    HPI  Patient presents today for a anxiety, depression, and insomnia follow up. Patient states compliance with medications and has no other concerns today. Patient denies any headaches, SOB, or chest pains.   Feels like when he was having problems with the tinnitus was flaring. He tried the [place] for 4 days without relief. He is not drinking caffeinated coffee (latte). He has had better success with the Xanax.   Wt Readings from Last 3 Encounters: 02/24/23 : 243 lb 3.2 oz (110.3 kg) 12/10/22 : 242 lb (109.8 kg) 10/14/22 : 245 lb (111.1 kg)    Insomnia Primary symptoms: no sleep disturbance, difficulty falling asleep.   PMH includes: associated symptoms present.      Past Medical History:  Diagnosis Date   Allergic rhinitis    Anal fissure    Anxiety    Depression    Family history of adverse reaction to anesthesia    mother - hard awaken   Gastric polyp 03/08/2005   Benign   GERD (gastroesophageal reflux disease)    H. pylori infection    Headache(784.0)    Hyperlipidemia 06/04/2012   Hypertension    IBS (irritable bowel syndrome)    Insomnia    Plantar fasciitis    PONV (postoperative nausea and vomiting)    Pre-diabetes      Family History  Problem Relation Age of Onset   Hypertension Mother    Diabetes Mother    Lung cancer Father    Colon cancer Neg Hx    Colon polyps Neg Hx    Crohn's disease Neg Hx    Esophageal cancer Neg Hx    Rectal cancer Neg Hx    Stomach cancer Neg Hx    Ulcerative colitis Neg Hx      Current Outpatient Medications:    Accu-Chek Softclix Lancets  lancets, Use to check blood sugars once daily R73.03, Disp: 100 each, Rfl: 12   atorvastatin (LIPITOR) 10 MG tablet, Take 1 tablet (10 mg total) by mouth daily., Disp: 90 tablet, Rfl: 2   augmented betamethasone dipropionate (DIPROLENE-AF) 0.05 % cream, Apply topically., Disp: , Rfl:    azelastine (ASTELIN) 0.1 % nasal spray, Place 2 sprays into both nostrils 2 (two) times daily. Use in each nostril as directed, Disp: 30 mL, Rfl: 6   Blood Glucose Monitoring Suppl (ACCU-CHEK GUIDE) w/Device KIT, Check blood sugars once daily R73.03, Disp: 1 kit, Rfl: 1   desonide (DESOWEN) 0.05 % cream, Apply topically 2 (two) times daily as needed., Disp: , Rfl:    Glucagon (GVOKE HYPOPEN 2-PACK) 0.5 MG/0.1ML SOAJ, Inject 1 each into the skin as needed., Disp: 0.1 mL, Rfl: 3   lisinopril-hydrochlorothiazide (ZESTORETIC) 20-25 MG tablet, TAKE 1 TABLET BY MOUTH DAILY, Disp: 90 tablet, Rfl: 3   metFORMIN (GLUCOPHAGE-XR) 500 MG 24 hr tablet, TAKE 1 TABLET BY MOUTH TWICE  DAILY, Disp: 180 tablet, Rfl: 3   omeprazole (PRILOSEC) 20 MG capsule, TAKE 1 CAPSULE BY MOUTH DAILY, Disp: 90 capsule, Rfl: 3   ondansetron (ZOFRAN) 4 MG tablet, Take 1 tablet (4 mg  total) by mouth daily as needed for nausea or vomiting., Disp: 30 tablet, Rfl: 1   ondansetron (ZOFRAN-ODT) 4 MG disintegrating tablet, 4mg  ODT q4 hours prn nausea/vomit, Disp: 10 tablet, Rfl: 0   OZEMPIC, 1 MG/DOSE, 4 MG/3ML SOPN, INJECT SUBCUTANEOUSLY 1 MG EVERY WEEK, Disp: 9 mL, Rfl: 3   scopolamine (TRANSDERM-SCOP) 1 MG/3DAYS, Place 1 patch (1.5 mg total) onto the skin every 3 (three) days., Disp: 10 patch, Rfl: 0   ALPRAZolam (XANAX) 1 MG tablet, TAKE 1 TABLET BY MOUTH EVERY NIGHT AT BEDTIME AS NEEDED FOR TINNITUS OR SLEEP, Disp: 30 tablet, Rfl: 5   glucose blood (ACCU-CHEK GUIDE) test strip, Use to check blood sugars once daily R73.03, Disp: 100 each, Rfl: 2   Allergies  Allergen Reactions   Hydrocodone     Headache, hypersensitivity     Review of Systems   Constitutional: Negative.   HENT: Negative.    Eyes: Negative.   Respiratory: Negative.    Cardiovascular: Negative.   Gastrointestinal: Negative.   Psychiatric/Behavioral:  Negative for sleep disturbance. The patient has insomnia. The patient is not nervous/anxious.      Today's Vitals   02/24/23 0832  BP: 110/70  Pulse: 73  Temp: 98.5 F (36.9 C)  TempSrc: Oral  Weight: 243 lb 3.2 oz (110.3 kg)  Height: 5\' 11"  (1.803 m)  PainSc: 0-No pain   Body mass index is 33.92 kg/m.  Wt Readings from Last 3 Encounters:  02/24/23 243 lb 3.2 oz (110.3 kg)  12/10/22 242 lb (109.8 kg)  10/14/22 245 lb (111.1 kg)    The ASCVD Risk score (Arnett DK, et al., 2019) failed to calculate for the following reasons:   The valid total cholesterol range is 130 to 320 mg/dL  Objective:  Physical Exam Vitals reviewed.  Constitutional:      General: He is not in acute distress.    Appearance: Normal appearance.  Cardiovascular:     Rate and Rhythm: Normal rate and regular rhythm.     Pulses: Normal pulses.     Heart sounds: Normal heart sounds. No murmur heard. Pulmonary:     Effort: Pulmonary effort is normal. No respiratory distress.     Breath sounds: Normal breath sounds. No wheezing.  Skin:    General: Skin is warm and dry.     Capillary Refill: Capillary refill takes less than 2 seconds.  Neurological:     General: No focal deficit present.     Mental Status: He is alert and oriented to person, place, and time.     Cranial Nerves: No cranial nerve deficit.     Motor: No weakness.           02/24/2023    8:31 AM 12/10/2022    8:29 AM 10/14/2022    8:35 AM 06/05/2022    8:24 AM 06/20/2021    8:55 AM  Depression screen PHQ 2/9  Decreased Interest 0 0 0 0 0  Down, Depressed, Hopeless 0 0 0 0 0  PHQ - 2 Score 0 0 0 0 0  Altered sleeping 0  0    Tired, decreased energy 0  0    Change in appetite 0  0    Feeling bad or failure about yourself  0  0    Trouble concentrating 0  0     Moving slowly or fidgety/restless 0  0    Suicidal thoughts 0  0    PHQ-9 Score 0  0    Difficult  doing work/chores Not difficult at all  Not difficult at all          02/24/2023    8:31 AM  GAD 7 : Generalized Anxiety Score  Nervous, Anxious, on Edge 0  Control/stop worrying 0  Worry too much - different things 0  Trouble relaxing 0  Restless 0  Easily annoyed or irritable 0  Afraid - awful might happen 0  Total GAD 7 Score 0  Anxiety Difficulty Not difficult at all     Assessment And Plan:   Problem List Items Addressed This Visit       Endocrine   Type 2 diabetes mellitus with obesity (HCC)    Slightly increased at last visit, continue current medications      Relevant Medications   glucose blood (ACCU-CHEK GUIDE) test strip   Other Relevant Orders   Lipid panel (Completed)     Other   INSOMNIA-SLEEP DISORDER-UNSPEC - Primary    Lunesta was ineffective, does better with Xanax. Encouraged to focus on improving sleep habits.       Hyperlipidemia    Cholesterol levels are stable. Continue current medications.       Relevant Orders   Hemoglobin A1c (Completed)   BMP8+eGFR (Completed)   Tinnitus    Continue Xanax, signed non opiod agreement. Refill sent      Relevant Medications   ALPRAZolam (XANAX) 1 MG tablet   Body mass index (BMI) 33.0-33.9, adult   Other Visit Diagnoses     Other long term (current) drug therapy       Relevant Orders   Urine Drug Panel 7        Return if symptoms worsen or fail to improve.   Patient was given opportunity to ask questions. Patient verbalized understanding of the plan and was able to repeat key elements of the plan. All questions were answered to their satisfaction.  Arnette Felts, FNP  I, Arnette Felts, FNP, have reviewed all documentation for this visit. The documentation on 02/27/23 for the exam, diagnosis, procedures, and orders are all accurate and complete.   IF YOU HAVE BEEN REFERRED TO A SPECIALIST, IT  MAY TAKE 1-2 WEEKS TO SCHEDULE/PROCESS THE REFERRAL. IF YOU HAVE NOT HEARD FROM US/SPECIALIST IN TWO WEEKS, PLEASE GIVE [place] A CALL AT [phone].

## 2023-02-24 NOTE — Assessment & Plan Note (Signed)
Continue Xanax, signed non opiod agreement. Refill sent

## 2023-02-24 NOTE — Assessment & Plan Note (Signed)
Lunesta was ineffective, does better with Xanax. Encouraged to focus on improving sleep habits.

## 2023-02-24 NOTE — Assessment & Plan Note (Signed)
Cholesterol levels are stable. Continue current medications.

## 2023-02-24 NOTE — Assessment & Plan Note (Signed)
Slightly increased at last visit, continue current medications

## 2023-02-25 LAB — LIPID PANEL
Chol/HDL Ratio: 3 ratio (ref 0.0–5.0)
Cholesterol, Total: 117 mg/dL (ref 100–199)
HDL: 39 mg/dL — ABNORMAL LOW (ref >39)
LDL Chol Calc (NIH): 65 mg/dL (ref 0–99)
Triglycerides: 59 mg/dL (ref 0–149)
VLDL Cholesterol Cal: 13 mg/dL (ref 5–40)

## 2023-02-25 LAB — HEMOGLOBIN A1C
Est. average glucose Bld gHb Est-mCnc: 117 mg/dL
Hgb A1c MFr Bld: 5.7 % — ABNORMAL HIGH (ref 4.8–5.6)

## 2023-02-25 LAB — BMP8+EGFR
BUN/Creatinine Ratio: 23 — ABNORMAL HIGH (ref 9–20)
BUN: 21 mg/dL (ref 6–24)
CO2: 23 mmol/L (ref 20–29)
Calcium: 9.8 mg/dL (ref 8.7–10.2)
Chloride: 97 mmol/L (ref 96–106)
Creatinine, Ser: 0.93 mg/dL (ref 0.76–1.27)
Glucose: 94 mg/dL (ref 70–99)
Potassium: 4 mmol/L (ref 3.5–5.2)
Sodium: 138 mmol/L (ref 134–144)
eGFR: 98 mL/min/{1.73_m2} (ref >59)

## 2023-02-27 DIAGNOSIS — Z6833 Body mass index (BMI) 33.0-33.9, adult: Secondary | ICD-10-CM | POA: Insufficient documentation

## 2023-02-28 LAB — URINE DRUG PANEL 7
Amphetamines, Urine: NEGATIVE ng/mL
Barbiturate Quant, Ur: NEGATIVE ng/mL
Benzodiazepine Quant, Ur: NEGATIVE
Cannabinoid Quant, Ur: NEGATIVE ng/mL
Cocaine (Metab.): NEGATIVE ng/mL
Opiate Quant, Ur: NEGATIVE ng/mL
PCP Quant, Ur: NEGATIVE ng/mL

## 2023-03-05 ENCOUNTER — Encounter: Payer: Self-pay | Admitting: Family Medicine

## 2023-03-05 ENCOUNTER — Encounter: Payer: Self-pay | Admitting: Physician Assistant

## 2023-03-05 ENCOUNTER — Ambulatory Visit: Payer: 59 | Admitting: Family Medicine

## 2023-03-05 VITALS — BP 130/82 | HR 80 | Temp 97.8°F | Ht 71.0 in | Wt 244.8 lb

## 2023-03-05 DIAGNOSIS — E66811 Obesity, class 1: Secondary | ICD-10-CM | POA: Insufficient documentation

## 2023-03-05 DIAGNOSIS — K649 Unspecified hemorrhoids: Secondary | ICD-10-CM | POA: Insufficient documentation

## 2023-03-05 DIAGNOSIS — E6609 Other obesity due to excess calories: Secondary | ICD-10-CM | POA: Insufficient documentation

## 2023-03-05 DIAGNOSIS — Z6834 Body mass index (BMI) 34.0-34.9, adult: Secondary | ICD-10-CM

## 2023-03-05 HISTORY — DX: Obesity, class 1: E66.811

## 2023-03-05 MED ORDER — HYDROCORTISONE (PERIANAL) 2.5 % EX CREA: 1.0000 | TOPICAL_CREAM | Freq: Two times a day (BID) | CUTANEOUS | 1 refills | Status: AC

## 2023-03-05 NOTE — Progress Notes (Signed)
I,Victoria T Hamilton, CMA,acting as a Neurosurgeon for [employer], NP.,have documented all relevant documentation on the behalf of Mikki Ziff, NP,as directed by  Rydan Gulyas Moshe Salisbury, NP while in the presence of Shantera Monts, NP.  Subjective:  Patient ID: [name] , male    DOB: 1970-12-31 , 52 y.o.   MRN: [id number]  Chief Complaint  Patient presents with   Hemorrhoids    HPI  Patient presents today for hemorrhoid noticed 2 weeks ago. He denies pain, bleeding or fever but admits discomfort sometimes. 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 At home he has used an OTC topical ointment.  He reports being established with GI, Dr Loreta Ave. They did not have any availability until October.  He states being open to having surgery to get this removed.      Past Medical History:  Diagnosis Date   Allergic rhinitis    Anal fissure    Anxiety    Depression    Family history of adverse reaction to anesthesia    mother - hard awaken   Gastric polyp 03/08/2005   Benign   GERD (gastroesophageal reflux disease)    H. pylori infection    Headache(784.0)    Hyperlipidemia 06/04/2012   Hypertension    IBS (irritable bowel syndrome)    Insomnia    Plantar fasciitis    PONV (postoperative nausea and vomiting)    Pre-diabetes      Family History  Problem Relation Age of Onset   Hypertension Mother    Diabetes Mother    Lung cancer Father    Colon cancer Neg Hx    Colon polyps Neg Hx    Crohn's disease Neg Hx    Esophageal cancer Neg Hx    Rectal cancer Neg Hx    Stomach cancer Neg Hx    Ulcerative colitis Neg Hx      Current Outpatient Medications:    Accu-Chek Softclix Lancets lancets, Use to check blood sugars once daily R73.03, Disp: 100 each, Rfl: 12   ALPRAZolam (XANAX) 1 MG tablet, TAKE 1 TABLET BY MOUTH EVERY NIGHT AT BEDTIME AS NEEDED FOR TINNITUS OR SLEEP, Disp: 30 tablet, Rfl: 5   atorvastatin (LIPITOR) 10 MG tablet, Take 1 tablet (10 mg total) by mouth  daily., Disp: 90 tablet, Rfl: 2   augmented betamethasone dipropionate (DIPROLENE-AF) 0.05 % cream, Apply topically., Disp: , Rfl:    azelastine (ASTELIN) 0.1 % nasal spray, Place 2 sprays into both nostrils 2 (two) times daily. Use in each nostril as directed, Disp: 30 mL, Rfl: 6   Blood Glucose Monitoring Suppl (ACCU-CHEK GUIDE) w/Device KIT, Check blood sugars once daily R73.03, Disp: 1 kit, Rfl: 1   desonide (DESOWEN) 0.05 % cream, Apply topically 2 (two) times daily as needed., Disp: , Rfl:    Glucagon (GVOKE HYPOPEN 2-PACK) 0.5 MG/0.1ML SOAJ, Inject 1 each into the skin as needed., Disp: 0.1 mL, Rfl: 3   glucose blood (ACCU-CHEK GUIDE) test strip, Use to check blood sugars once daily R73.03, Disp: 100 each, Rfl: 2   hydrocortisone (ANUSOL-HC) 2.5 % rectal cream, Place 1 Application rectally 2 (two) times daily for 20 days., Disp: 30 g, Rfl: 1   lisinopril-hydrochlorothiazide (ZESTORETIC) 20-25 MG tablet, TAKE 1 TABLET BY MOUTH DAILY, Disp: 90 tablet, Rfl: 3   metFORMIN (GLUCOPHAGE-XR) 500 MG 24 hr tablet, TAKE 1 TABLET BY MOUTH TWICE  DAILY, Disp: 180 tablet, Rfl: 3   omeprazole (PRILOSEC) 20 MG capsule,  TAKE 1 CAPSULE BY MOUTH DAILY, Disp: 90 capsule, Rfl: 3   ondansetron (ZOFRAN) 4 MG tablet, Take 1 tablet (4 mg total) by mouth daily as needed for nausea or vomiting., Disp: 30 tablet, Rfl: 1   ondansetron (ZOFRAN-ODT) 4 MG disintegrating tablet, 4mg  ODT q4 hours prn nausea/vomit, Disp: 10 tablet, Rfl: 0   OZEMPIC, 1 MG/DOSE, 4 MG/3ML SOPN, INJECT SUBCUTANEOUSLY 1 MG EVERY WEEK, Disp: 9 mL, Rfl: 3   scopolamine (TRANSDERM-SCOP) 1 MG/3DAYS, Place 1 patch (1.5 mg total) onto the skin every 3 (three) days., Disp: 10 patch, Rfl: 0   Allergies  Allergen Reactions   Hydrocodone     Headache, hypersensitivity     Review of Systems  Constitutional: Negative.   HENT: Negative.    Respiratory: Negative.    Cardiovascular: Negative.   Gastrointestinal:  Negative for abdominal pain, anal  bleeding and rectal pain.  Skin: Negative.   Allergic/Immunologic: Negative.   Neurological: Negative.   Hematological: Negative.      Today's Vitals   03/05/23 0911  BP: 130/82  Pulse: 80  Temp: 97.8 F (36.6 C)  Weight: 244 lb 12.8 oz (111 kg)  Height: 5\' 11"  (1.803 m)   Body mass index is 34.14 kg/m.  Wt Readings from Last 3 Encounters:  03/05/23 244 lb 12.8 oz (111 kg)  02/24/23 243 lb 3.2 oz (110.3 kg)  12/10/22 242 lb (109.8 kg)     Objective:  Physical Exam Genitourinary:    Rectum: External hemorrhoid present.  Skin:    General: Skin is warm and dry.  Neurological:     Mental Status: He is alert and oriented to person, place, and time.  Psychiatric:        Mood and Affect: Mood normal.        Behavior: Behavior normal.         Assessment And Plan:  Hemorrhoids, unspecified hemorrhoid type -     Hydrocortisone (Perianal); Place 1 Application rectally 2 (two) times daily for 20 days.  Dispense: 30 g; Refill: 1  Class 1 obesity due to excess calories with serious comorbidity and body mass index (BMI) of 34.0 to 34.9 in adult  He is encouraged to strive for BMI less than 30 to decrease cardiac risk. Advised to aim for at least 150 minutes of exercise per week.    Return for Keep previously scheduled appt.  Patient was given opportunity to ask questions. Patient verbalized understanding of the plan and was able to repeat key elements of the plan. All questions were answered to their satisfaction.  Ty Buntrock Moshe Salisbury, NP  I, Lacorey Brusca Moshe Salisbury, NP, have reviewed all documentation for this visit. The documentation on 03/05/23 for the exam, diagnosis, procedures, and orders are all accurate and complete.   IF YOU HAVE BEEN REFERRED TO A SPECIALIST, IT MAY TAKE 1-2 WEEKS TO SCHEDULE/PROCESS THE REFERRAL. IF YOU HAVE NOT HEARD FROM US/SPECIALIST IN TWO WEEKS, PLEASE GIVE [place] A CALL AT [phone].   THE PATIENT IS ENCOURAGED TO PRACTICE SOCIAL DISTANCING DUE TO THE  COVID-19 PANDEMIC.

## 2023-03-05 NOTE — Assessment & Plan Note (Signed)
She is encouraged to strive for BMI less than 30 to decrease cardiac risk. Advised to aim for at least 150 minutes of exercise per week.  

## 2023-03-05 NOTE — Assessment & Plan Note (Signed)
Apply cream as directed

## 2023-04-13 ENCOUNTER — Other Ambulatory Visit: Payer: Self-pay

## 2023-04-13 MED ORDER — ATORVASTATIN CALCIUM 10 MG PO TABS: 10.0000 mg | ORAL_TABLET | Freq: Every day | ORAL | 2 refills | Status: DC

## 2023-04-20 ENCOUNTER — Encounter: Payer: Self-pay | Admitting: Nurse Practitioner

## 2023-05-15 ENCOUNTER — Ambulatory Visit: Payer: 59 | Admitting: Physician Assistant

## 2023-06-11 ENCOUNTER — Encounter: Payer: 59 | Admitting: Nurse Practitioner

## 2023-07-03 NOTE — Progress Notes (Deleted)
Madelaine Bhat, CMA,acting as a Neurosurgeon for Arnette Felts, FNP.,have documented all relevant documentation on the behalf of Arnette Felts, FNP,as directed by  Arnette Felts, FNP while in the presence of Arnette Felts, FNP.  Subjective:  Patient ID: [name] , male    DOB: 07/04/1971 , 52 y.o.   MRN: [id number]  No chief complaint on file.   HPI  Patient presents today for a bp and dm follow up, Patient reports compliance with medication. Patient denies any chest pain, SOB, or headaches. Patient has no concerns today.     Past Medical History:  Diagnosis Date  . Allergic rhinitis   . Anal fissure   . Anxiety   . Depression   . Family history of adverse reaction to anesthesia    mother - hard awaken  . Gastric polyp 03/08/2005   Benign  . GERD (gastroesophageal reflux disease)   . H. pylori infection   . Headache(784.0)   . Hyperlipidemia 06/04/2012  . Hypertension   . IBS (irritable bowel syndrome)   . Insomnia   . Plantar fasciitis   . PONV (postoperative nausea and vomiting)   . Pre-diabetes      Family History  Problem Relation Age of Onset  . Hypertension Mother   . Diabetes Mother   . Lung cancer Father   . Colon cancer Neg Hx   . Colon polyps Neg Hx   . Crohn's disease Neg Hx   . Esophageal cancer Neg Hx   . Rectal cancer Neg Hx   . Stomach cancer Neg Hx   . Ulcerative colitis Neg Hx      Current Outpatient Medications:  .  Accu-Chek Softclix Lancets lancets, Use to check blood sugars once daily R73.03, Disp: 100 each, Rfl: 12 .  ALPRAZolam (XANAX) 1 MG tablet, TAKE 1 TABLET BY MOUTH EVERY NIGHT AT BEDTIME AS NEEDED FOR TINNITUS OR SLEEP, Disp: 30 tablet, Rfl: 5 .  atorvastatin (LIPITOR) 10 MG tablet, Take 1 tablet (10 mg total) by mouth daily., Disp: 90 tablet, Rfl: 2 .  augmented betamethasone dipropionate (DIPROLENE-AF) 0.05 % cream, Apply topically., Disp: , Rfl:  .  azelastine (ASTELIN) 0.1 % nasal spray, Place 2 sprays into both nostrils 2 (two) times  daily. Use in each nostril as directed, Disp: 30 mL, Rfl: 6 .  Blood Glucose Monitoring Suppl (ACCU-CHEK GUIDE) w/Device KIT, Check blood sugars once daily R73.03, Disp: 1 kit, Rfl: 1 .  desonide (DESOWEN) 0.05 % cream, Apply topically 2 (two) times daily as needed., Disp: , Rfl:  .  Glucagon (GVOKE HYPOPEN 2-PACK) 0.5 MG/0.1ML SOAJ, Inject 1 each into the skin as needed., Disp: 0.1 mL, Rfl: 3 .  glucose blood (ACCU-CHEK GUIDE) test strip, Use to check blood sugars once daily R73.03, Disp: 100 each, Rfl: 2 .  lisinopril-hydrochlorothiazide (ZESTORETIC) 20-25 MG tablet, TAKE 1 TABLET BY MOUTH DAILY, Disp: 90 tablet, Rfl: 3 .  metFORMIN (GLUCOPHAGE-XR) 500 MG 24 hr tablet, TAKE 1 TABLET BY MOUTH TWICE  DAILY, Disp: 180 tablet, Rfl: 3 .  omeprazole (PRILOSEC) 20 MG capsule, TAKE 1 CAPSULE BY MOUTH DAILY, Disp: 90 capsule, Rfl: 3 .  ondansetron (ZOFRAN) 4 MG tablet, Take 1 tablet (4 mg total) by mouth daily as needed for nausea or vomiting., Disp: 30 tablet, Rfl: 1 .  ondansetron (ZOFRAN-ODT) 4 MG disintegrating tablet, 4mg  ODT q4 hours prn nausea/vomit, Disp: 10 tablet, Rfl: 0 .  OZEMPIC, 1 MG/DOSE, 4 MG/3ML SOPN, INJECT SUBCUTANEOUSLY 1 MG EVERY WEEK, Disp:  9 mL, Rfl: 3 .  scopolamine (TRANSDERM-SCOP) 1 MG/3DAYS, Place 1 patch (1.5 mg total) onto the skin every 3 (three) days., Disp: 10 patch, Rfl: 0   Allergies  Allergen Reactions  . Hydrocodone     Headache, hypersensitivity     Review of Systems  Constitutional: Negative.   HENT: Negative.    Eyes: Negative.   Respiratory: Negative.    Cardiovascular: Negative.   Gastrointestinal: Negative.     There were no vitals filed for this visit. There is no height or weight on file to calculate BMI.  Wt Readings from Last 3 Encounters:  03/05/23 244 lb 12.8 oz (111 kg)  02/24/23 243 lb 3.2 oz (110.3 kg)  12/10/22 242 lb (109.8 kg)    The ASCVD Risk score (Arnett DK, et al., 2019) failed to calculate for the following reasons:   The valid  total cholesterol range is 130 to 320 mg/dL  Objective:  Physical Exam      Assessment And Plan:  Type 2 diabetes mellitus with obesity (HCC)  Mixed hyperlipidemia  Essential hypertension  Other insomnia    No follow-ups on file.  Patient was given opportunity to ask questions. Patient verbalized understanding of the plan and was able to repeat key elements of the plan. All questions were answered to their satisfaction.    Jeanell Sparrow, FNP, have reviewed all documentation for this visit. The documentation on 07/03/23 for the exam, diagnosis, procedures, and orders are all accurate and complete.   IF YOU HAVE BEEN REFERRED TO A SPECIALIST, IT MAY TAKE 1-2 WEEKS TO SCHEDULE/PROCESS THE REFERRAL. IF YOU HAVE NOT HEARD FROM US/SPECIALIST IN TWO WEEKS, PLEASE GIVE [place] A CALL AT [phone].

## 2023-07-06 ENCOUNTER — Encounter: Payer: Self-pay | Admitting: Internal Medicine

## 2023-07-06 ENCOUNTER — Ambulatory Visit: Payer: 59 | Admitting: Internal Medicine

## 2023-07-06 ENCOUNTER — Encounter: Payer: Self-pay | Admitting: Nurse Practitioner

## 2023-07-06 ENCOUNTER — Ambulatory Visit: Payer: 59 | Admitting: Nurse Practitioner

## 2023-07-06 VITALS — BP 120/78 | HR 75 | Temp 97.9°F | Ht 71.0 in | Wt 239.0 lb

## 2023-07-06 DIAGNOSIS — E785 Hyperlipidemia, unspecified: Secondary | ICD-10-CM | POA: Diagnosis not present

## 2023-07-06 DIAGNOSIS — I10 Essential (primary) hypertension: Secondary | ICD-10-CM

## 2023-07-06 DIAGNOSIS — E1169 Type 2 diabetes mellitus with other specified complication: Secondary | ICD-10-CM

## 2023-07-06 DIAGNOSIS — G4709 Other insomnia: Secondary | ICD-10-CM

## 2023-07-06 DIAGNOSIS — Z23 Encounter for immunization: Secondary | ICD-10-CM

## 2023-07-06 DIAGNOSIS — E66811 Obesity, class 1: Secondary | ICD-10-CM

## 2023-07-06 DIAGNOSIS — Z6833 Body mass index (BMI) 33.0-33.9, adult: Secondary | ICD-10-CM

## 2023-07-06 DIAGNOSIS — S61309A Unspecified open wound of unspecified finger with damage to nail, initial encounter: Secondary | ICD-10-CM

## 2023-07-06 DIAGNOSIS — E782 Mixed hyperlipidemia: Secondary | ICD-10-CM

## 2023-07-06 DIAGNOSIS — E6609 Other obesity due to excess calories: Secondary | ICD-10-CM

## 2023-07-06 LAB — HM DIABETES EYE EXAM

## 2023-07-06 NOTE — Assessment & Plan Note (Signed)
Chronic, well controlled. He will continue with lisinopril/hct 20/25mg  daily. He is encouraged to follow low sodium diet.

## 2023-07-06 NOTE — Assessment & Plan Note (Signed)
He is encouraged to initially strive for BMI less than 30 to decrease cardiac risk. He is advised to exercise no less than 150 minutes per week.

## 2023-07-06 NOTE — Assessment & Plan Note (Addendum)
Chronic, most recent labs reviewed from June 2024. His diabetes is under excellent control. No need to make any med changes today. He is encouraged to schedule his yearly eye exam ASAP.  He will rto in February 2025 for his next physical exam.

## 2023-07-06 NOTE — Progress Notes (Signed)
I,[name], CMA,acting as a Neurosurgeon for Gwynneth Aliment, MD.,have documented all relevant documentation on the behalf of Gwynneth Aliment, MD,as directed by  Gwynneth Aliment, MD while in the presence of Gwynneth Aliment, MD.  Subjective:  Patient ID: [name] , male    DOB: [birth year] , 52 y.o.   MRN: [id number]  Chief Complaint  Patient presents with   Hypertension   Diabetes    HPI  Patient presents today for a bp and dm follow up, Patient reports compliance with medication. Patient denies any chest pain, SOB, or headaches. Patient has no concerns today. Patient reported he is going to go try to get his eye exam done today. 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      Past Medical History:  Diagnosis Date   Allergic rhinitis    Anal fissure    Anxiety    Depression    Family history of adverse reaction to anesthesia    mother - hard awaken   Gastric polyp 03/08/2005   Benign   GERD (gastroesophageal reflux disease)    H. pylori infection    Headache(784.0)    Hyperlipidemia 06/04/2012   Hypertension    IBS (irritable bowel syndrome)    Insomnia    Plantar fasciitis    PONV (postoperative nausea and vomiting)    Pre-diabetes      Family History  Problem Relation Age of Onset   Hypertension Mother    Diabetes Mother    Lung cancer Father    Colon cancer Neg Hx    Colon polyps Neg Hx    Crohn's disease Neg Hx    Esophageal cancer Neg Hx    Rectal cancer Neg Hx    Stomach cancer Neg Hx    Ulcerative colitis Neg Hx      Current Outpatient Medications:    Accu-Chek Softclix Lancets lancets, Use to check blood sugars once daily R73.03, Disp: 100 each, Rfl: 12   ALPRAZolam (XANAX) 1 MG tablet, TAKE 1 TABLET BY MOUTH EVERY NIGHT AT BEDTIME AS NEEDED FOR TINNITUS OR SLEEP, Disp: 30 tablet, Rfl: 5   atorvastatin (LIPITOR) 10 MG tablet, Take 1 tablet (10 mg total) by mouth daily., Disp: 90 tablet, Rfl: 2   azelastine (ASTELIN) 0.1 % nasal  spray, Place 2 sprays into both nostrils 2 (two) times daily. Use in each nostril as directed, Disp: 30 mL, Rfl: 6   Blood Glucose Monitoring Suppl (ACCU-CHEK GUIDE) w/Device KIT, Check blood sugars once daily R73.03, Disp: 1 kit, Rfl: 1   desonide (DESOWEN) 0.05 % cream, Apply topically 2 (two) times daily as needed., Disp: , Rfl:    Glucagon (GVOKE HYPOPEN 2-PACK) 0.5 MG/0.1ML SOAJ, Inject 1 each into the skin as needed., Disp: 0.1 mL, Rfl: 3   glucose blood (ACCU-CHEK GUIDE) test strip, Use to check blood sugars once daily R73.03, Disp: 100 each, Rfl: 2   lisinopril-hydrochlorothiazide (ZESTORETIC) 20-25 MG tablet, TAKE 1 TABLET BY MOUTH DAILY, Disp: 90 tablet, Rfl: 3   metFORMIN (GLUCOPHAGE-XR) 500 MG 24 hr tablet, TAKE 1 TABLET BY MOUTH TWICE  DAILY, Disp: 180 tablet, Rfl: 3   omeprazole (PRILOSEC) 20 MG capsule, TAKE 1 CAPSULE BY MOUTH DAILY, Disp: 90 capsule, Rfl: 3   OZEMPIC, 1 MG/DOSE, 4 MG/3ML SOPN, INJECT SUBCUTANEOUSLY 1 MG EVERY WEEK, Disp: 9 mL, Rfl: 3   Allergies  Allergen Reactions   Hydrocodone     Headache, hypersensitivity  Review of Systems  Constitutional: Negative.   Eyes: Negative.   Respiratory: Negative.    Cardiovascular: Negative.   Gastrointestinal: Negative.   Endocrine: Negative for polydipsia, polyphagia and polyuria.  Musculoskeletal: Negative.   Skin: Negative.   Psychiatric/Behavioral: Negative.       Today's Vitals   07/06/23 0912  BP: 120/78  Pulse: 75  Temp: 97.9 F (36.6 C)  Weight: 239 lb (108.4 kg)  Height: 5\' 11"  (1.803 m)  PainSc: 0-No pain   Body mass index is 33.33 kg/m.  Wt Readings from Last 3 Encounters:  07/06/23 239 lb (108.4 kg)  03/05/23 244 lb 12.8 oz (111 kg)  02/24/23 243 lb 3.2 oz (110.3 kg)    The ASCVD Risk score (Arnett DK, et al., 2019) failed to calculate for the following reasons:   The valid total cholesterol range is 130 to 320 mg/dL  Objective:  Physical Exam Vitals and nursing note reviewed.   Constitutional:      Appearance: Normal appearance. He is obese.  HENT:     Head: Normocephalic and atraumatic.  Eyes:     Extraocular Movements: Extraocular movements intact.  Cardiovascular:     Rate and Rhythm: Normal rate and regular rhythm.     Heart sounds: Normal heart sounds.  Pulmonary:     Effort: Pulmonary effort is normal.     Breath sounds: Normal breath sounds.  Musculoskeletal:     Cervical back: Normal range of motion.  Skin:    General: Skin is warm.     Comments: Left ring finger, dried blood on nail near cuticle Nail has grown in halfway Slight erythema medial cuticle  Neurological:     General: No focal deficit present.     Mental Status: He is alert.  Psychiatric:        Mood and Affect: Mood normal.         Assessment And Plan:  Dyslipidemia associated with type 2 diabetes mellitus (HCC) Assessment & Plan: Chronic, most recent labs reviewed from June 2024. His diabetes is under excellent control. No need to make any med changes today. He is encouraged to schedule his yearly eye exam ASAP.  He will rto in February 2025 for his next physical exam.   Orders: -     CMP14+EGFR -     Hemoglobin A1c -     TSH  Essential hypertension Assessment & Plan: Chronic, well controlled. He will continue with lisinopril/hct 20/25mg  daily. He is encouraged to follow low sodium diet.   Orders: -     CMP14+EGFR  Nail avulsion, finger, initial encounter Assessment & Plan: Occurred on 8/15 - he states he smashed his left 4th finger at work while putting drums on a skid. A coworker tripped and bumped into his which jammed his finger in between the drums. He did not seek medical attention, he used pliers to remove the nail. It has started to grow back. He was given sample of Bacitracin to apply to cuticle twice daily for next 5-6 days. No further intervention is needed at this time.    Class 1 obesity due to excess calories with body mass index (BMI) of 33.0 to 33.9  in adult, unspecified whether serious comorbidity present Assessment & Plan: He is encouraged to initially strive for BMI less than 30 to decrease cardiac risk. He is advised to exercise no less than 150 minutes per week.     Immunization due -     PFIZER Comirnaty(GRAY TOP)COVID-19 Vaccine  Return in 4 months (on 11/06/2023), or physical, for controlled DM check-4 months.  Patient was given opportunity to ask questions. Patient verbalized understanding of the plan and was able to repeat key elements of the plan. All questions were answered to their satisfaction.    I, Gwynneth Aliment, MD, have reviewed all documentation for this visit. The documentation on 07/06/23 for the exam, diagnosis, procedures, and orders are all accurate and complete.   IF YOU HAVE BEEN REFERRED TO A SPECIALIST, IT MAY TAKE 1-2 WEEKS TO SCHEDULE/PROCESS THE REFERRAL. IF YOU HAVE NOT HEARD FROM US/SPECIALIST IN TWO WEEKS, PLEASE GIVE [place] A CALL AT [phone].

## 2023-07-06 NOTE — Assessment & Plan Note (Signed)
Occurred on 8/15 - he states he smashed his left 4th finger at work while putting drums on a skid. A coworker tripped and bumped into his which jammed his finger in between the drums. He did not seek medical attention, he used pliers to remove the nail. It has started to grow back. He was given sample of Bacitracin to apply to cuticle twice daily for next 5-6 days. No further intervention is needed at this time.

## 2023-07-07 LAB — HEMOGLOBIN A1C
Est. average glucose Bld gHb Est-mCnc: 123 mg/dL
Hgb A1c MFr Bld: 5.9 % — ABNORMAL HIGH (ref 4.8–5.6)

## 2023-07-07 LAB — CMP14+EGFR
ALT: 27 [IU]/L (ref 0–44)
AST: 20 [IU]/L (ref 0–40)
Albumin: 5 g/dL — ABNORMAL HIGH (ref 3.8–4.9)
Alkaline Phosphatase: 65 [IU]/L (ref 44–121)
BUN/Creatinine Ratio: 21 — ABNORMAL HIGH (ref 9–20)
BUN: 19 mg/dL (ref 6–24)
Bilirubin Total: 0.5 mg/dL (ref 0.0–1.2)
CO2: 22 mmol/L (ref 20–29)
Calcium: 10 mg/dL (ref 8.7–10.2)
Chloride: 98 mmol/L (ref 96–106)
Creatinine, Ser: 0.9 mg/dL (ref 0.76–1.27)
Globulin, Total: 2.9 g/dL (ref 1.5–4.5)
Glucose: 86 mg/dL (ref 70–99)
Potassium: 3.8 mmol/L (ref 3.5–5.2)
Sodium: 139 mmol/L (ref 134–144)
Total Protein: 7.9 g/dL (ref 6.0–8.5)
eGFR: 102 mL/min/{1.73_m2} (ref >59)

## 2023-07-07 LAB — TSH: TSH: 0.732 u[IU]/mL (ref 0.450–4.500)

## 2023-07-09 ENCOUNTER — Ambulatory Visit: Payer: 59 | Admitting: Nurse Practitioner

## 2023-07-16 ENCOUNTER — Encounter: Payer: Self-pay | Admitting: Nurse Practitioner

## 2023-08-24 ENCOUNTER — Encounter: Payer: Self-pay | Admitting: Nurse Practitioner

## 2023-08-24 NOTE — Telephone Encounter (Signed)
 Care team updated and letter sent for eye exam notes.

## 2023-09-16 ENCOUNTER — Other Ambulatory Visit: Payer: Self-pay | Admitting: Nurse Practitioner

## 2023-09-16 DIAGNOSIS — H9313 Tinnitus, bilateral: Secondary | ICD-10-CM

## 2023-09-17 ENCOUNTER — Other Ambulatory Visit: Payer: Self-pay | Admitting: Nurse Practitioner

## 2023-09-17 DIAGNOSIS — H9313 Tinnitus, bilateral: Secondary | ICD-10-CM

## 2023-09-17 MED ORDER — ALPRAZOLAM 1 MG PO TABS: ORAL_TABLET | ORAL | 0 refills | Status: DC

## 2023-09-22 ENCOUNTER — Telehealth (INDEPENDENT_AMBULATORY_CARE_PROVIDER_SITE_OTHER): Payer: 59 | Admitting: Nurse Practitioner

## 2023-09-22 ENCOUNTER — Encounter: Payer: Self-pay | Admitting: Nurse Practitioner

## 2023-09-22 DIAGNOSIS — H9312 Tinnitus, left ear: Secondary | ICD-10-CM | POA: Diagnosis not present

## 2023-09-22 MED ORDER — ALPRAZOLAM 1 MG PO TABS: ORAL_TABLET | ORAL | 5 refills | Status: DC

## 2023-09-22 NOTE — Assessment & Plan Note (Signed)
 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 Refill for alprazolam sent to pharmacy with refills will renew at next visit with physical to try to keep on track better.

## 2023-09-22 NOTE — Progress Notes (Signed)
 Virtual Visit via Video Note  LILLETTE Kristeen JINNY Gladis, CMA,acting as a scribe for Eddie Ada, FNP.,have documented all relevant documentation on the behalf of Eddie Ada, FNP,as directed by  Eddie Ada, FNP while in the presence of Eddie Ada, FNP.  I connected with [name] on 09/22/23 at  2:20 PM EST by a video enabled telemedicine application and verified that I am speaking with the correct person using two identifiers.  Patient Location: Home Provider location: Office  I discussed the limitations, risks, security, and privacy concerns of performing an evaluation and management service by video and the availability of in person appointments. I also discussed with the patient that there may be a patient responsible charge related to this service. The patient expressed understanding and agreed to proceed.  Subjective: PCP: Horne Gaines, FNP  No chief complaint on file.  Patient presents today for a alprazolam  follow up for his tinnitus to left ear.  He is taking alprazolam  daily.  Patient reports compliance with medication. Patient denies any chest pain, SOB, or headaches. Patient has no concerns today.  He did have a physical for his job in November.     Review of Systems  Constitutional: Negative.   HENT:  Positive for tinnitus.   Respiratory: Negative.    Cardiovascular: Negative.   Musculoskeletal: Negative.   Neurological: Negative.   Psychiatric/Behavioral: Negative.       Current Outpatient Medications:    Accu-Chek Softclix Lancets lancets, Use to check blood sugars once daily R73.03, Disp: 100 each, Rfl: 12   atorvastatin  (LIPITOR) 10 MG tablet, Take 1 tablet (10 mg total) by mouth daily., Disp: 90 tablet, Rfl: 2   azelastine  (ASTELIN ) 0.1 % nasal spray, Place 2 sprays into both nostrils 2 (two) times daily. Use in each nostril as directed, Disp: 30 mL, Rfl: 6   Blood Glucose Monitoring Suppl (ACCU-CHEK GUIDE) w/Device KIT, Check blood sugars once daily R73.03,  Disp: 1 kit, Rfl: 1   desonide (DESOWEN) 0.05 % cream, Apply topically 2 (two) times daily as needed., Disp: , Rfl:    Glucagon  (GVOKE HYPOPEN  2-PACK) 0.5 MG/0.1ML SOAJ, Inject 1 each into the skin as needed., Disp: 0.1 mL, Rfl: 3   glucose blood (ACCU-CHEK GUIDE) test strip, Use to check blood sugars once daily R73.03, Disp: 100 each, Rfl: 2   lisinopril -hydrochlorothiazide  (ZESTORETIC ) 20-25 MG tablet, TAKE 1 TABLET BY MOUTH DAILY, Disp: 90 tablet, Rfl: 3   metFORMIN  (GLUCOPHAGE -XR) 500 MG 24 hr tablet, TAKE 1 TABLET BY MOUTH TWICE  DAILY, Disp: 180 tablet, Rfl: 3   omeprazole  (PRILOSEC ) 20 MG capsule, TAKE 1 CAPSULE BY MOUTH DAILY, Disp: 90 capsule, Rfl: 3   OZEMPIC , 1 MG/DOSE, 4 MG/3ML SOPN, INJECT SUBCUTANEOUSLY 1 MG EVERY WEEK, Disp: 9 mL, Rfl: 3   ALPRAZolam  (XANAX ) 1 MG tablet, TAKE 1 TABLET BY MOUTH EVERY NIGHT AT BEDTIME AS NEEDED FOR TINNITUS OR SLEEP, Disp: 30 tablet, Rfl: 5  Observations/Objective: There were no vitals filed for this visit. Physical Exam Vitals reviewed.  Constitutional:      General: He is not in acute distress.    Appearance: Normal appearance.  Pulmonary:     Effort: Pulmonary effort is normal. No respiratory distress.  Neurological:     General: No focal deficit present.     Mental Status: He is alert and oriented to person, place, and time.     Cranial Nerves: No cranial nerve deficit.  Psychiatric:        Mood and Affect:  Mood normal.        Behavior: Behavior normal.        Thought Content: Thought content normal.        Judgment: Judgment normal.     Assessment and Plan: Tinnitus, left ear Assessment & Plan: Continues with alprazolam  once a day for the tinnitus to his left ear. Refill for alprazolam  sent to pharmacy with refills will renew at next visit with physical to try to keep on track better.  Orders: -     ALPRAZolam ; TAKE 1 TABLET BY MOUTH EVERY NIGHT AT BEDTIME AS NEEDED FOR TINNITUS OR SLEEP  Dispense: 30 tablet; Refill:  5    Follow Up Instructions: No follow-ups on file.   I discussed the assessment and treatment plan with the patient. The patient was provided an opportunity to ask questions, and all were answered. The patient agreed with the plan and demonstrated an understanding of the instructions.   The patient was advised to call back or seek an in-person evaluation if the symptoms worsen or if the condition fails to improve as anticipated.  The above assessment and management plan was discussed with the patient. The patient verbalized understanding of and has agreed to the management plan.   LILLETTE Eddie Ada, FNP, have reviewed all documentation for this visit. The documentation on 09/22/23 for the exam, diagnosis, procedures, and orders are all accurate and complete.

## 2023-10-16 ENCOUNTER — Other Ambulatory Visit: Payer: Self-pay | Admitting: Nurse Practitioner

## 2023-10-16 DIAGNOSIS — E1169 Type 2 diabetes mellitus with other specified complication: Secondary | ICD-10-CM

## 2023-12-01 ENCOUNTER — Encounter: Payer: 59 | Admitting: Nurse Practitioner

## 2023-12-16 ENCOUNTER — Other Ambulatory Visit: Payer: Self-pay | Admitting: Nurse Practitioner

## 2023-12-16 DIAGNOSIS — I10 Essential (primary) hypertension: Secondary | ICD-10-CM

## 2023-12-18 ENCOUNTER — Other Ambulatory Visit: Payer: Self-pay | Admitting: Nurse Practitioner

## 2023-12-31 ENCOUNTER — Ambulatory Visit: Payer: 59 | Admitting: Nurse Practitioner

## 2023-12-31 ENCOUNTER — Encounter: Payer: Self-pay | Admitting: Nurse Practitioner

## 2023-12-31 VITALS — BP 122/80 | HR 71 | Temp 97.9°F | Ht 71.0 in | Wt 255.0 lb

## 2023-12-31 DIAGNOSIS — E785 Hyperlipidemia, unspecified: Secondary | ICD-10-CM

## 2023-12-31 DIAGNOSIS — Z79899 Other long term (current) drug therapy: Secondary | ICD-10-CM

## 2023-12-31 DIAGNOSIS — E1169 Type 2 diabetes mellitus with other specified complication: Secondary | ICD-10-CM | POA: Diagnosis not present

## 2023-12-31 DIAGNOSIS — I10 Essential (primary) hypertension: Secondary | ICD-10-CM | POA: Diagnosis not present

## 2023-12-31 DIAGNOSIS — R0981 Nasal congestion: Secondary | ICD-10-CM | POA: Insufficient documentation

## 2023-12-31 DIAGNOSIS — E669 Obesity, unspecified: Secondary | ICD-10-CM

## 2023-12-31 MED ORDER — IPRATROPIUM BROMIDE 0.03 % NA SOLN: 2.0000 | Freq: Three times a day (TID) | NASAL | 2 refills | Status: AC

## 2023-12-31 MED ORDER — METFORMIN HCL ER 500 MG PO TB24: 500.0000 mg | ORAL_TABLET | Freq: Two times a day (BID) | ORAL | 3 refills | Status: DC

## 2023-12-31 MED ORDER — METFORMIN HCL ER 500 MG PO TB24: 500.0000 mg | ORAL_TABLET | Freq: Two times a day (BID) | ORAL | 3 refills | Status: AC

## 2023-12-31 MED ORDER — OMEPRAZOLE 20 MG PO CPDR: 20.0000 mg | DELAYED_RELEASE_CAPSULE | Freq: Every day | ORAL | 3 refills | Status: AC

## 2023-12-31 NOTE — Progress Notes (Signed)
 I,Rhonin Trott T Basil Lim, CMA,acting as a Neurosurgeon for Susanna Epley, FNP.,have documented all relevant documentation on the behalf of Susanna Epley, FNP,as directed by  Susanna Epley, FNP while in the presence of Susanna Epley, FNP.  Subjective:  Patient ID: [name] , male    DOB: 08-Oct-1970 , 53 y.o.   MRN: [id number]  No chief complaint on file.   HPI  HPI   Past Medical History:  Diagnosis Date   Allergic rhinitis    Anal fissure    Anxiety    Depression    Family history of adverse reaction to anesthesia    mother - hard awaken   Gastric polyp 03/08/2005   Benign   GERD (gastroesophageal reflux disease)    H. pylori infection    Headache(784.0)    Hyperlipidemia 06/04/2012   Hypertension    IBS (irritable bowel syndrome)    Insomnia    Plantar fasciitis    PONV (postoperative nausea and vomiting)    Pre-diabetes      Family History  Problem Relation Age of Onset   Hypertension Mother    Diabetes Mother    Lung cancer Father    Colon cancer Neg Hx    Colon polyps Neg Hx    Crohn's disease Neg Hx    Esophageal cancer Neg Hx    Rectal cancer Neg Hx    Stomach cancer Neg Hx    Ulcerative colitis Neg Hx      Current Outpatient Medications:    Accu-Chek Softclix Lancets lancets, Use to check blood sugars once daily R73.03, Disp: 100 each, Rfl: 12   ALPRAZolam  (XANAX ) 1 MG tablet, TAKE 1 TABLET BY MOUTH EVERY NIGHT AT BEDTIME AS NEEDED FOR TINNITUS OR SLEEP, Disp: 30 tablet, Rfl: 5   atorvastatin  (LIPITOR) 10 MG tablet, TAKE 1 TABLET BY MOUTH DAILY, Disp: 90 tablet, Rfl: 3   azelastine  (ASTELIN ) 0.1 % nasal spray, Place 2 sprays into both nostrils 2 (two) times daily. Use in each nostril as directed, Disp: 30 mL, Rfl: 6   Blood Glucose Monitoring Suppl (ACCU-CHEK GUIDE) w/Device KIT, Check blood sugars once daily R73.03, Disp: 1 kit, Rfl: 1   desonide (DESOWEN) 0.05 % cream, Apply topically 2 (two) times daily as needed., Disp: , Rfl:    Glucagon  (GVOKE HYPOPEN   2-PACK) 0.5 MG/0.1ML SOAJ, Inject 1 each into the skin as needed., Disp: 0.1 mL, Rfl: 3   glucose blood (ACCU-CHEK GUIDE) test strip, Use to check blood sugars once daily R73.03, Disp: 100 each, Rfl: 2   lisinopril -hydrochlorothiazide  (ZESTORETIC ) 20-25 MG tablet, TAKE 1 TABLET BY MOUTH DAILY, Disp: 90 tablet, Rfl: 3   metFORMIN  (GLUCOPHAGE -XR) 500 MG 24 hr tablet, TAKE 1 TABLET BY MOUTH TWICE  DAILY, Disp: 180 tablet, Rfl: 3   omeprazole  (PRILOSEC ) 20 MG capsule, TAKE 1 CAPSULE BY MOUTH DAILY, Disp: 90 capsule, Rfl: 3   OZEMPIC , 1 MG/DOSE, 4 MG/3ML SOPN, INJECT SUBCUTANEOUSLY 1 MG EVERY WEEK, Disp: 9 mL, Rfl: 3   Allergies  Allergen Reactions   Hydrocodone     Headache, hypersensitivity     Review of Systems   There were no vitals filed for this visit. There is no height or weight on file to calculate BMI.  Wt Readings from Last 3 Encounters:  07/06/23 239 lb (108.4 kg)  03/05/23 244 lb 12.8 oz (111 kg)  02/24/23 243 lb 3.2 oz (110.3 kg)     Objective:  Physical Exam      Assessment And Plan:  There are no diagnoses linked to this encounter.   No follow-ups on file.  Patient was given opportunity to ask questions. Patient verbalized understanding of the plan and was able to repeat key elements of the plan. All questions were answered to their satisfaction.  Susanna Epley, FNP  I, Susanna Epley, FNP, have reviewed all documentation for this visit. The documentation on 12/31/23 for the exam, diagnosis, procedures, and orders are all accurate and complete.   IF YOU HAVE BEEN REFERRED TO A SPECIALIST, IT MAY TAKE 1-2 WEEKS TO SCHEDULE/PROCESS THE REFERRAL. IF YOU HAVE NOT HEARD FROM US /SPECIALIST IN TWO WEEKS, PLEASE GIVE US  A CALL AT [phone].   THE PATIENT IS ENCOURAGED TO PRACTICE SOCIAL DISTANCING DUE TO THE COVID-19 PANDEMIC.

## 2023-12-31 NOTE — Progress Notes (Signed)
 I,[name], CMA,acting as a Neurosurgeon for [employer], FNP.,have documented all relevant documentation on the behalf of Susanna Epley, FNP,as directed by  Susanna Epley, FNP while in the presence of Susanna Epley, FNP.  Subjective:  Patient ID: [name] , male    DOB: 1970-11-30 , 53 y.o.   MRN: [id number]  Chief Complaint  Patient presents with   Diabetes   Hypertension    HPI  Patient presents today for a bp and dm follow up, Patient reports compliance with medication. Patient denies any chest pain, SOB, or headaches. Patient has no concerns today.   Diabetes He presents for his follow-up diabetic visit. He has type 2 diabetes mellitus. There are no hypoglycemic associated symptoms. Pertinent negatives for diabetes include no polydipsia, no polyphagia and no polyuria. There are no hypoglycemic complications. Risk factors for coronary artery disease include obesity, male sex, hypertension, diabetes mellitus and dyslipidemia. He is following a generally healthy diet. When asked about meal planning, he reported none. He has not had a previous visit with a dietitian. He rarely participates in exercise. He does not see a podiatrist.Eye exam is current.  Hypertension This is a chronic problem. The current episode started more than 1 year ago. The problem is controlled. Pertinent negatives include no anxiety or malaise/fatigue. Risk factors for coronary artery disease include obesity, male gender and diabetes mellitus. There is no history of angina.    Past Medical History:  Diagnosis Date   Allergic rhinitis    Anal fissure    Anxiety    Depression    DIZZINESS 11/11/2007   With chronic tinnitus     Family history of adverse reaction to anesthesia    mother - hard awaken   Gastric polyp 03/08/2005   Benign   GERD (gastroesophageal reflux disease)    H. pylori infection    Headache(784.0)    Hyperlipidemia 06/04/2012   Hypertension    IBS (irritable bowel syndrome)     Insomnia    Plantar fasciitis    PONV (postoperative nausea and vomiting)    Pre-diabetes      Family History  Problem Relation Age of Onset   Hypertension Mother    Diabetes Mother    Lung cancer Father    Colon cancer Neg Hx    Colon polyps Neg Hx    Crohn's disease Neg Hx    Esophageal cancer Neg Hx    Rectal cancer Neg Hx    Stomach cancer Neg Hx    Ulcerative colitis Neg Hx      Current Outpatient Medications:    Accu-Chek Softclix Lancets lancets, Use to check blood sugars once daily R73.03, Disp: 100 each, Rfl: 12   ALPRAZolam  (XANAX ) 1 MG tablet, TAKE 1 TABLET BY MOUTH EVERY NIGHT AT BEDTIME AS NEEDED FOR TINNITUS OR SLEEP, Disp: 30 tablet, Rfl: 5   atorvastatin  (LIPITOR) 10 MG tablet, TAKE 1 TABLET BY MOUTH DAILY, Disp: 90 tablet, Rfl: 3   Blood Glucose Monitoring Suppl (ACCU-CHEK GUIDE) w/Device KIT, Check blood sugars once daily R73.03, Disp: 1 kit, Rfl: 1   desonide (DESOWEN) 0.05 % cream, Apply topically 2 (two) times daily as needed., Disp: , Rfl:    Glucagon  (GVOKE HYPOPEN  2-PACK) 0.5 MG/0.1ML SOAJ, Inject 1 each into the skin as needed., Disp: 0.1 mL, Rfl: 3   glucose blood (ACCU-CHEK GUIDE) test strip, Use to check blood sugars once daily R73.03, Disp: 100 each, Rfl: 2   ipratropium (ATROVENT ) 0.03 % nasal spray, Place  2 sprays into the nose 3 (three) times daily., Disp: 30 mL, Rfl: 2   lisinopril -hydrochlorothiazide  (ZESTORETIC ) 20-25 MG tablet, TAKE 1 TABLET BY MOUTH DAILY, Disp: 90 tablet, Rfl: 3   OZEMPIC , 1 MG/DOSE, 4 MG/3ML SOPN, INJECT SUBCUTANEOUSLY 1 MG EVERY WEEK, Disp: 9 mL, Rfl: 3   metFORMIN  (GLUCOPHAGE -XR) 500 MG 24 hr tablet, Take 1 tablet (500 mg total) by mouth 2 (two) times daily., Disp: 90 tablet, Rfl: 3   omeprazole  (PRILOSEC ) 20 MG capsule, Take 1 capsule (20 mg total) by mouth daily., Disp: 90 capsule, Rfl: 3   Allergies  Allergen Reactions   Hydrocodone     Headache, hypersensitivity     Review of Systems  Constitutional: Negative.   Negative for malaise/fatigue.  Eyes: Negative.   Respiratory: Negative.    Cardiovascular: Negative.   Endocrine: Negative for polydipsia, polyphagia and polyuria.  Musculoskeletal: Negative.   Skin: Negative.   Neurological: Negative.   Psychiatric/Behavioral: Negative.       Today's Vitals   12/31/23 0821  BP: 122/80  Pulse: 71  Temp: 97.9 F (36.6 C)  TempSrc: Oral  Weight: 255 lb (115.7 kg)  Height: 5\' 11"  (1.803 m)  PainSc: 0-No pain   Body mass index is 35.57 kg/m.  Wt Readings from Last 3 Encounters:  12/31/23 255 lb (115.7 kg)  07/06/23 239 lb (108.4 kg)  03/05/23 244 lb 12.8 oz (111 kg)     Objective:  Physical Exam Vitals and nursing note reviewed.  Constitutional:      General: He is not in acute distress.    Appearance: Normal appearance.  HENT:     Nose: Congestion present.     Right Turbinates: Enlarged (slightly enlarged).     Left Turbinates: Enlarged.  Cardiovascular:     Rate and Rhythm: Normal rate and regular rhythm.     Pulses: Normal pulses.     Heart sounds: Normal heart sounds. No murmur heard. Pulmonary:     Effort: Pulmonary effort is normal. No respiratory distress.     Breath sounds: Normal breath sounds. No wheezing.  Skin:    General: Skin is warm and dry.     Capillary Refill: Capillary refill takes less than 2 seconds.  Neurological:     General: No focal deficit present.     Mental Status: He is alert and oriented to person, place, and time.     Cranial Nerves: No cranial nerve deficit.     Motor: No weakness.      Assessment And Plan:  Dyslipidemia associated with type 2 diabetes mellitus (HCC) -     Microalbumin / creatinine urine ratio -     CMP14+EGFR -     Hemoglobin A1c -     Lipid panel  Type 2 diabetes mellitus with obesity (HCC) Assessment & Plan: Slightly increased at last visit, continue current medications, tolerating Ozempic  well.   Orders: -     metFORMIN  HCl ER; Take 1 tablet (500 mg total) by mouth  2 (two) times daily.  Dispense: 90 tablet; Refill: 3  Essential hypertension Assessment & Plan: Blood pressure is well controlled, continue current medications  Orders: -     CMP14+EGFR  Nasal congestion Assessment & Plan: Advised to avoid using Afrin due to causing rebound congestion. Will change nasal spray to ipitropium nasal spray. He does have enlarged turbinates more to left nostril than right.   Orders: -     Ipratropium Bromide ; Place 2 sprays into the nose 3 (three) times  daily.  Dispense: 30 mL; Refill: 2  Other long term (current) drug therapy -     CBC  Other orders -     Omeprazole ; Take 1 capsule (20 mg total) by mouth daily.  Dispense: 90 capsule; Refill: 3    Return for controlled DM check-4 months.  Patient was given opportunity to ask questions. Patient verbalized understanding of the plan and was able to repeat key elements of the plan. All questions were answered to their satisfaction.    Inge Mangle, FNP, have reviewed all documentation for this visit. The documentation on 12/31/23 for the exam, diagnosis, procedures, and orders are all accurate and complete.   IF YOU HAVE BEEN REFERRED TO A SPECIALIST, IT MAY TAKE 1-2 WEEKS TO SCHEDULE/PROCESS THE REFERRAL. IF YOU HAVE NOT HEARD FROM US /SPECIALIST IN TWO WEEKS, PLEASE GIVE US  A CALL AT [phone] X [phone].

## 2023-12-31 NOTE — Patient Instructions (Signed)

## 2023-12-31 NOTE — Assessment & Plan Note (Signed)
 Slightly increased at last visit, continue current medications, tolerating Ozempic  well.

## 2023-12-31 NOTE — Assessment & Plan Note (Signed)
 Blood pressure is well controlled, continue current medications.

## 2023-12-31 NOTE — Assessment & Plan Note (Signed)
 Advised to avoid using Afrin due to causing rebound congestion. Will change nasal spray to ipitropium nasal spray. He does have enlarged turbinates more to left nostril than right.

## 2024-01-01 LAB — CBC
Hematocrit: 48.7 % (ref 37.5–51.0)
Hemoglobin: 16 g/dL (ref 13.0–17.7)
MCH: 29.1 pg (ref 26.6–33.0)
MCHC: 32.9 g/dL (ref 31.5–35.7)
MCV: 89 fL (ref 79–97)
Platelets: 208 10*3/uL (ref 150–450)
RBC: 5.49 x10E6/uL (ref 4.14–5.80)
RDW: 13.2 % (ref 11.6–15.4)
WBC: 5.1 10*3/uL (ref 3.4–10.8)

## 2024-01-01 LAB — CMP14+EGFR
ALT: 35 IU/L (ref 0–44)
AST: 21 IU/L (ref 0–40)
Albumin: 4.8 g/dL (ref 3.8–4.9)
Alkaline Phosphatase: 62 IU/L (ref 44–121)
BUN/Creatinine Ratio: 17 (ref 9–20)
BUN: 15 mg/dL (ref 6–24)
Bilirubin Total: 0.5 mg/dL (ref 0.0–1.2)
CO2: 25 mmol/L (ref 20–29)
Calcium: 9.7 mg/dL (ref 8.7–10.2)
Chloride: 98 mmol/L (ref 96–106)
Creatinine, Ser: 0.88 mg/dL (ref 0.76–1.27)
Globulin, Total: 2.5 g/dL (ref 1.5–4.5)
Glucose: 81 mg/dL (ref 70–99)
Potassium: 3.8 mmol/L (ref 3.5–5.2)
Sodium: 139 mmol/L (ref 134–144)
Total Protein: 7.3 g/dL (ref 6.0–8.5)
eGFR: 102 mL/min/{1.73_m2} (ref >59)

## 2024-01-01 LAB — HEMOGLOBIN A1C
Est. average glucose Bld gHb Est-mCnc: 120 mg/dL
Hgb A1c MFr Bld: 5.8 % — ABNORMAL HIGH (ref 4.8–5.6)

## 2024-01-01 LAB — LIPID PANEL
Chol/HDL Ratio: 3 ratio (ref 0.0–5.0)
Cholesterol, Total: 121 mg/dL (ref 100–199)
HDL: 40 mg/dL (ref >39)
LDL Chol Calc (NIH): 66 mg/dL (ref 0–99)
Triglycerides: 73 mg/dL (ref 0–149)
VLDL Cholesterol Cal: 15 mg/dL (ref 5–40)

## 2024-01-01 LAB — MICROALBUMIN / CREATININE URINE RATIO
Creatinine, Urine: 140.7 mg/dL
Microalb/Creat Ratio: 4 mg/g{creat} (ref 0–29)
Microalbumin, Urine: 6.1 ug/mL

## 2024-01-06 ENCOUNTER — Encounter: Payer: Self-pay | Admitting: Nurse Practitioner

## 2024-01-18 ENCOUNTER — Other Ambulatory Visit: Payer: Self-pay | Admitting: Nurse Practitioner

## 2024-01-18 DIAGNOSIS — H9312 Tinnitus, left ear: Secondary | ICD-10-CM

## 2024-01-18 MED ORDER — ALPRAZOLAM 1 MG PO TABS: ORAL_TABLET | ORAL | 1 refills | Status: DC

## 2024-01-18 NOTE — Telephone Encounter (Signed)
 Last Fill: 09/22/23   Last OV: 12/31/23 Next OV: 04/21/24  Routing to provider for review/authorization.

## 2024-01-18 NOTE — Telephone Encounter (Signed)
 Copied from CRM [id number]. Topic: Clinical - Medication Refill >> Jan 18, 2024  3:49 PM Rosaria Common wrote: Medication: ALPRAZolam  (XANAX ) 1 MG tablet  Has the patient contacted their pharmacy? Yes (Agent: If no, request that the patient contact the pharmacy for the refill. If patient does not wish to contact the pharmacy document the reason why and proceed with request.) (Agent: If yes, when and what did the pharmacy advise?)  This is the patient's preferred pharmacy:  [facility]  Address: [address] [place], [place], [place] [address] Phone: [phone]  Is this the correct pharmacy for this prescription? Yes If no, delete pharmacy and type the correct one.   Has the prescription been filled recently? Yes  Is the patient out of the medication? Yes  Has the patient been seen for an appointment in the last year OR does the patient have an upcoming appointment? Yes  Can we respond through MyChart? Yes  Agent: Please be advised that Rx refills may take up to 3 business days. We ask that you follow-up with your pharmacy.

## 2024-02-15 ENCOUNTER — Other Ambulatory Visit: Payer: Self-pay | Admitting: Nurse Practitioner

## 2024-02-15 DIAGNOSIS — H9312 Tinnitus, left ear: Secondary | ICD-10-CM

## 2024-02-16 ENCOUNTER — Other Ambulatory Visit: Payer: Self-pay | Admitting: Nurse Practitioner

## 2024-02-16 DIAGNOSIS — H9312 Tinnitus, left ear: Secondary | ICD-10-CM

## 2024-02-16 NOTE — Telephone Encounter (Signed)
 Copied from CRM [id number]. Topic: Clinical - Medication Refill >> Feb 16, 2024  9:14 AM Fonda T wrote: Medication: ALPRAZolam  (XANAX ) 1 MG tablet  Has the patient contacted their pharmacy? Yes (Agent: If no, request that the patient contact the pharmacy for the refill. If patient does not wish to contact the pharmacy document the reason why and proceed with request.) (Agent: If yes, when and what did the pharmacy advise?)  This is the patient's preferred pharmacy:   [facility][id number] [place], [place] - [address] AT [facility] [address] [facility] [address] [place] [address] Phone: [phone] Fax: [phone]   Is this the correct pharmacy for this prescription? Yes If no, delete pharmacy and type the correct one.   Has the prescription been filled recently? Yes  Is the patient out of the medication? Yes  Has the patient been seen for an appointment in the last year OR does the patient have an upcoming appointment? Yes  Can we respond through MyChart? Yes  Agent: Please be advised that Rx refills may take up to 3 business days. We ask that you follow-up with your pharmacy.

## 2024-02-22 NOTE — Telephone Encounter (Signed)
 Copied from CRM [id number]. Topic: Clinical - Medication Refill >> Feb 22, 2024  9:12 AM Emmet Harm C wrote: Medication: ALPRAZolam  (XANAX ) 1 MG tablet  Has the patient contacted their pharmacy? Yes (Agent: If no, request that the patient contact the pharmacy for the refill. If patient does not wish to contact the pharmacy document the reason why and proceed with request.) (Agent: If yes, when and what did the pharmacy advise?)  This is the patient's preferred pharmacy:  [facility] #[id number] [place], [place] - [address] AT [facility] OF [address] & [facility] [address] [place] [address] Phone: [phone] Fax: [phone]    Is this the correct pharmacy for this prescription? Yes If no, delete pharmacy and type the correct one.   Has the prescription been filled recently? No  Is the patient out of the medication? Yes  Has the patient been seen for an appointment in the last year OR does the patient have an upcoming appointment? Yes  Can we respond through MyChart? No  Agent: Please be advised that Rx refills may take up to 3 business days. We ask that you follow-up with your pharmacy.

## 2024-03-18 ENCOUNTER — Other Ambulatory Visit: Payer: Self-pay | Admitting: Nurse Practitioner

## 2024-03-18 DIAGNOSIS — H9312 Tinnitus, left ear: Secondary | ICD-10-CM

## 2024-03-18 NOTE — Telephone Encounter (Unsigned)
 Copied from CRM [id number]. Topic: Clinical - Medication Refill >> Mar 18, 2024  9:40 AM Donee H wrote: Medication: ALPRAZolam  (XANAX ) 1 MG tablet   Has the patient contacted their pharmacy? Yes, pharmacy advise patient to reach out to provider for a refill request  [facility] [id number] [place], [place] - [address] [facility] [address] [place] [place] [address] Phone: [phone] Fax: [phone]  Is this the correct pharmacy for this prescription? Yes  Has the prescription been filled recently? no  Is the patient out of the medication? Yes, and asking if it can be refilled as soon as possible   Has the patient been seen for an appointment in the last year OR does the patient have an upcoming appointment? Yes  Can we respond through MyChart? Yes  Agent: Please be advised that Rx refills may take up to 3 business days. We ask that you follow-up with your pharmacy.

## 2024-03-21 ENCOUNTER — Telehealth: Payer: Self-pay | Admitting: Nurse Practitioner

## 2024-03-21 DIAGNOSIS — H9312 Tinnitus, left ear: Secondary | ICD-10-CM

## 2024-03-21 NOTE — Telephone Encounter (Signed)
 Copied from CRM [id number]. Topic: Clinical - Medication Refill >> Mar 21, 2024  5:53 PM Kevelyn M wrote: Medication: ALPRAZolam  (XANAX ) 1 MG tablet   Has the patient contacted their pharmacy? Yes (Agent: If no, request that the patient contact the pharmacy for the refill. If patient does not wish to contact the pharmacy document the reason why and proceed with request.) (Agent: If yes, when and what did the pharmacy advise?)  This is the patient's preferred pharmacy:  [facility] [id number] - [place], [place] - [address] [facility] [address] [facility] [address] [place] [address] Phone: [phone] Fax: [phone]  Is this the correct pharmacy for this prescription? Yes If no, delete pharmacy and type the correct one.   Has the prescription been filled recently? No  Is the patient out of the medication? Yes  Has the patient been seen for an appointment in the last year OR does the patient have an upcoming appointment? Yes  Can we respond through MyChart? Yes  Agent: Please be advised that Rx refills may take up to 3 business days. We ask that you follow-up with your pharmacy.

## 2024-03-22 MED ORDER — ALPRAZOLAM 1 MG PO TABS: ORAL_TABLET | ORAL | 1 refills | Status: DC

## 2024-03-28 NOTE — Progress Notes (Addendum)
 LILLETTE Kristeen JINNY Gladis, CMA,acting as a Neurosurgeon for [name], FNP.,have documented all relevant documentation on the behalf of [name], FNP,as directed by  [name], FNP while in the presence of [name], FNP.  Subjective:  Patient ID: [name] , male    DOB: Sep 09, 1970 , 53 y.o.   MRN: [id number]  Chief Complaint  Patient presents with   Diabetes    Patient presents today for a bp and dm follow up, Patient reports compliance with medication. Patient denies any chest pain, SOB, or headaches.    Knee Pain    Patient reports he injured his right knee in February, he reports he was pushing something and heard a pop in his knee. He reports he was pushing something again in July and it caused pain again now he has a knot in the back of his right leg. He was seen at urgent care and was told about a tore ligament he is currently taking prednisone 10mg . He is seeing ortho Thursday. He reports his right knee is also popping a lot     HPI  Patient presents today for hypertension and diabetes follow up. Doing well with his medications.   He has started on prednisone taper for a knee injury. He injured his knee Feb 5th while pushing a 3000lb skid he heard a pop and reported it at work. Went back to work and was okay for a few weeks. When he went to the Urgent Care July 10th after having knee pain again. He has had 2 drug test before the urgent care visit. He was placed on light duty, did statement for workers comp and was denied. He is going to [employer] [facility] on Thursday. He went back to urgent care on Saturday to be released back to work full without restrictions. He also has a knot to the back of his right knee.  He is having more urinary frequency since taking the prednisone.   Diabetes He presents for his follow-up diabetic visit. He has type 2 diabetes mellitus. There are no hypoglycemic associated symptoms. Pertinent negatives for hypoglycemia include no dizziness or  headaches. Pertinent negatives for diabetes include no blurred vision. There are no hypoglycemic complications. There are no diabetic complications. Risk factors for coronary artery disease include obesity and male sex. Current diabetic treatment includes oral agent (dual therapy). His weight is stable. He is following a generally healthy diet. When asked about meal planning, he reported none. He has not had a previous visit with a dietitian. He participates in exercise daily Cherlynn - Friday). (Blood sugar is 95 - 125. )     Past Medical History:  Diagnosis Date   Allergic rhinitis    Anal fissure    Anxiety    Chronic rhinitis 12/13/2020   Class 1 obesity due to excess calories with body mass index (BMI) of 33.0 to 33.9 in adult 03/05/2023   Depression    DIZZINESS 11/11/2007   With chronic tinnitus     Family history of adverse reaction to anesthesia    mother - hard awaken   Gastric polyp 03/08/2005   Benign   GERD (gastroesophageal reflux disease)    H. pylori infection    Headache(784.0)    Hyperlipidemia 06/04/2012   Hypertension    IBS (irritable bowel syndrome)    Insomnia    Lumbar pain 05/31/2020   Plantar fasciitis    PONV (postoperative nausea and vomiting)    Pre-diabetes      Family History  Problem Relation Age of Onset   Hypertension Mother    Diabetes Mother    Heart disease Mother    Lung cancer Father    Cancer Father    Colon cancer Neg Hx    Colon polyps Neg Hx    Crohn's disease Neg Hx    Esophageal cancer Neg Hx    Rectal cancer Neg Hx    Stomach cancer Neg Hx    Ulcerative colitis Neg Hx      Current Outpatient Medications:    Accu-Chek Softclix Lancets lancets, Use to check blood sugars once daily R73.03, Disp: 100 each, Rfl: 12   ALPRAZolam  (XANAX ) 1 MG tablet, TAKE 1 TABLET BY MOUTH EVERY NIGHT AT BEDTIME AS NEEDED FOR TINNITUS OR SLEEP, Disp: 30 tablet, Rfl: 1   atorvastatin  (LIPITOR) 10 MG tablet, TAKE 1 TABLET BY MOUTH DAILY, Disp: 90  tablet, Rfl: 3   Blood Glucose Monitoring Suppl (ACCU-CHEK GUIDE) w/Device KIT, Check blood sugars once daily R73.03, Disp: 1 kit, Rfl: 1   desonide (DESOWEN) 0.05 % cream, Apply topically 2 (two) times daily as needed., Disp: , Rfl:    Glucagon  (GVOKE HYPOPEN  2-PACK) 0.5 MG/0.1ML SOAJ, Inject 1 each into the skin as needed., Disp: 0.1 mL, Rfl: 3   glucose blood (ACCU-CHEK GUIDE) test strip, Use to check blood sugars once daily R73.03, Disp: 100 each, Rfl: 2   ipratropium (ATROVENT ) 0.03 % nasal spray, Place 2 sprays into the nose 3 (three) times daily., Disp: 30 mL, Rfl: 2   lisinopril -hydrochlorothiazide  (ZESTORETIC ) 20-25 MG tablet, TAKE 1 TABLET BY MOUTH DAILY, Disp: 90 tablet, Rfl: 3   metFORMIN  (GLUCOPHAGE -XR) 500 MG 24 hr tablet, Take 1 tablet (500 mg total) by mouth 2 (two) times daily., Disp: 90 tablet, Rfl: 3   omeprazole  (PRILOSEC ) 20 MG capsule, Take 1 capsule (20 mg total) by mouth daily., Disp: 90 capsule, Rfl: 3   OZEMPIC , 1 MG/DOSE, 4 MG/3ML SOPN, INJECT SUBCUTANEOUSLY 1 MG EVERY WEEK, Disp: 9 mL, Rfl: 3   Allergies  Allergen Reactions   Hydrocodone     Headache, hypersensitivity     Review of Systems  Constitutional: Negative.   Eyes: Negative.  Negative for blurred vision.  Respiratory: Negative.    Cardiovascular: Negative.   Musculoskeletal:        Right knee pain   Neurological:  Negative for dizziness and headaches.  Psychiatric/Behavioral: Negative.       Today's Vitals   03/29/24 0831  BP: 120/74  Pulse: 66  Temp: 98.7 F (37.1 C)  TempSrc: Oral  Weight: 256 lb 3.2 oz (116.2 kg)  Height: 5' 11 (1.803 m)  PainSc: 2   PainLoc: Knee   Body mass index is 35.73 kg/m.  Wt Readings from Last 3 Encounters:  03/29/24 256 lb 3.2 oz (116.2 kg)  12/31/23 255 lb (115.7 kg)  07/06/23 239 lb (108.4 kg)      Objective:  Physical Exam Vitals and nursing note reviewed.  Constitutional:      General: He is not in acute distress.    Appearance: Normal  appearance.  Cardiovascular:     Rate and Rhythm: Normal rate and regular rhythm.     Pulses: Normal pulses.     Heart sounds: Normal heart sounds. No murmur heard. Pulmonary:     Effort: Pulmonary effort is normal. No respiratory distress.     Breath sounds: Normal breath sounds. No wheezing.  Musculoskeletal:        General: Tenderness (posterior knee) and  signs of injury present. No swelling.     Comments: He has a firm lump to posterior patella when leg is extended and not as prominent with flexion.   Skin:    General: Skin is warm and dry.     Capillary Refill: Capillary refill takes less than 2 seconds.  Neurological:     General: No focal deficit present.     Mental Status: He is alert and oriented to person, place, and time.     Cranial Nerves: No cranial nerve deficit.     Motor: No weakness.      Diabetic foot exam was performed with the following findings:   No deformities, ulcerations, or other skin breakdown Normal sensation of 10g monofilament Intact posterior tibialis and dorsalis pedis pulses Decreased sensation bilateral feet     Assessment And Plan:  Type 2 diabetes mellitus with hyperlipidemia (HCC) Assessment & Plan: HgbA1c is stable, continue current medications, tolerating Ozempic  well. Currently on prednisone for knee injury advised to monitor his blood sugars  Orders: -     BMP8+eGFR -     Lipid panel -     Hemoglobin A1c  Essential hypertension Assessment & Plan: Blood pressure is well controlled, continue current medications   Class 2 severe obesity due to excess calories with serious comorbidity and body mass index (BMI) of 35.0 to 35.9 in adult [facility]) Assessment & Plan: He is encouraged to strive for BMI less than 30 to decrease cardiac risk. Advised to aim for at least 150 minutes of exercise per week.    COVID-19 vaccination declined Assessment & Plan: Declines covid 19 vaccine. Discussed risk of covid 32 and if he changes her mind  about the vaccine to call the office. Education has been provided regarding the importance of this vaccine but patient still declined. Advised may receive this vaccine at local pharmacy or Health Dept.or vaccine clinic. Aware to provide a copy of the vaccination record if obtained from local pharmacy or Health Dept.  Encouraged to take multivitamin, vitamin d, vitamin c and zinc to increase immune system. Aware can call office if would like to have vaccine here at office. Verbalized acceptance and understanding.    Tinnitus of both ears Assessment & Plan: Continues with alprazolam  once a day for the tinnitus to his left ear. Had a refill sent on 03/22/2024 will send additional refills with his next refill.    Chronic pain of right knee Assessment & Plan: Had a knee injury at work in February but did not get seen at Urgent care until July. He has an appt scheduled for Thursday with orthopedics and is currently taking prednisone. He has a semi firm mass to posterior patella which decreases in size with flexion. If we need to do anything like an ultrasound he is to notify me.     Return for controlled DM check 4 months.   Patient was given opportunity to ask questions. Patient verbalized understanding of the plan and was able to repeat key elements of the plan. All questions were answered to their satisfaction.    LILLETTE [name], FNP, have reviewed all documentation for this visit. The documentation on 03/29/24 for the exam, diagnosis, procedures, and orders are all accurate and complete.   IF YOU HAVE BEEN REFERRED TO A SPECIALIST, IT MAY TAKE 1-2 WEEKS TO SCHEDULE/PROCESS THE REFERRAL. IF YOU HAVE NOT HEARD FROM US /SPECIALIST IN TWO WEEKS, PLEASE GIVE US  A CALL AT [phone].

## 2024-03-29 ENCOUNTER — Ambulatory Visit: Admitting: Nurse Practitioner

## 2024-03-29 VITALS — BP 120/74 | HR 66 | Temp 98.7°F | Ht 71.0 in | Wt 256.2 lb

## 2024-03-29 DIAGNOSIS — G8929 Other chronic pain: Secondary | ICD-10-CM | POA: Insufficient documentation

## 2024-03-29 DIAGNOSIS — E785 Hyperlipidemia, unspecified: Secondary | ICD-10-CM

## 2024-03-29 DIAGNOSIS — H9313 Tinnitus, bilateral: Secondary | ICD-10-CM | POA: Diagnosis not present

## 2024-03-29 DIAGNOSIS — Z6835 Body mass index (BMI) 35.0-35.9, adult: Secondary | ICD-10-CM

## 2024-03-29 DIAGNOSIS — Z2821 Immunization not carried out because of patient refusal: Secondary | ICD-10-CM | POA: Insufficient documentation

## 2024-03-29 DIAGNOSIS — I10 Essential (primary) hypertension: Secondary | ICD-10-CM | POA: Diagnosis not present

## 2024-03-29 DIAGNOSIS — E66812 Obesity, class 2: Secondary | ICD-10-CM

## 2024-03-29 DIAGNOSIS — E1169 Type 2 diabetes mellitus with other specified complication: Secondary | ICD-10-CM

## 2024-03-29 DIAGNOSIS — M25561 Pain in right knee: Secondary | ICD-10-CM

## 2024-03-29 NOTE — Assessment & Plan Note (Signed)
 Continues with alprazolam  once a day for the tinnitus to his left ear. Had a refill sent on 03/22/2024 will send additional refills with his next refill.

## 2024-03-29 NOTE — Assessment & Plan Note (Signed)
 Blood pressure is well controlled, continue current medications.

## 2024-03-29 NOTE — Assessment & Plan Note (Signed)

## 2024-03-29 NOTE — Assessment & Plan Note (Signed)
 He is encouraged to strive for BMI less than 30 to decrease cardiac risk. Advised to aim for at least 150 minutes of exercise per week.

## 2024-03-29 NOTE — Assessment & Plan Note (Signed)
 Had a knee injury at work in February but did not get seen at Urgent care until July. He has an appt scheduled for Thursday with orthopedics and is currently taking prednisone. He has a semi firm mass to posterior patella which decreases in size with flexion. If we need to do anything like an ultrasound he is to notify me.

## 2024-03-29 NOTE — Assessment & Plan Note (Addendum)
 HgbA1c is stable, continue current medications, tolerating Ozempic  well. Currently on prednisone for knee injury advised to monitor his blood sugars

## 2024-03-30 LAB — BMP8+EGFR
BUN/Creatinine Ratio: 24 — ABNORMAL HIGH (ref 9–20)
BUN: 21 mg/dL (ref 6–24)
CO2: 20 mmol/L (ref 20–29)
Calcium: 9.9 mg/dL (ref 8.7–10.2)
Chloride: 97 mmol/L (ref 96–106)
Creatinine, Ser: 0.86 mg/dL (ref 0.76–1.27)
Glucose: 96 mg/dL (ref 70–99)
Potassium: 3.9 mmol/L (ref 3.5–5.2)
Sodium: 138 mmol/L (ref 134–144)
eGFR: 103 mL/min/1.73 (ref >59)

## 2024-03-30 LAB — HEMOGLOBIN A1C
Est. average glucose Bld gHb Est-mCnc: 120 mg/dL
Hgb A1c MFr Bld: 5.8 % — ABNORMAL HIGH (ref 4.8–5.6)

## 2024-03-30 LAB — LIPID PANEL
Chol/HDL Ratio: 3.1 ratio (ref 0.0–5.0)
Cholesterol, Total: 141 mg/dL (ref 100–199)
HDL: 46 mg/dL (ref >39)
LDL Chol Calc (NIH): 81 mg/dL (ref 0–99)
Triglycerides: 68 mg/dL (ref 0–149)
VLDL Cholesterol Cal: 14 mg/dL (ref 5–40)

## 2024-04-14 ENCOUNTER — Ambulatory Visit: Payer: Self-pay | Admitting: Nurse Practitioner

## 2024-04-14 ENCOUNTER — Other Ambulatory Visit: Payer: Self-pay | Admitting: Nurse Practitioner

## 2024-04-14 DIAGNOSIS — H9312 Tinnitus, left ear: Secondary | ICD-10-CM

## 2024-04-14 MED ORDER — ALPRAZOLAM 1 MG PO TABS: ORAL_TABLET | ORAL | 3 refills | Status: DC

## 2024-04-14 NOTE — Telephone Encounter (Unsigned)
 Copied from CRM [id number]. Topic: Clinical - Medication Refill >> Apr 14, 2024 12:17 PM Fonda T wrote: Medication: ALPRAZolam  (XANAX ) 1 MG tablet  Per patient will be out within 2 weeks, and at that time will be on vacation, reason for early request  Has the patient contacted their pharmacy? Yes (Agent: If no, request that the patient contact the pharmacy for the refill. If patient does not wish to contact the pharmacy document the reason why and proceed with request.) (Agent: If yes, when and what did the pharmacy advise?)  This is the patient's preferred pharmacy:   [facility] [id number] - [place], [place] - [address] [place] [address] Phone: [phone] Fax: [phone]  Is this the correct pharmacy for this prescription? Yes If no, delete pharmacy and type the correct one.   Has the prescription been filled recently? Yes  Is the patient out of the medication? No  Has the patient been seen for an appointment in the last year OR does the patient have an upcoming appointment? Yes  Can we respond through MyChart? No, prefers phone call  Agent: Please be advised that Rx refills may take up to 3 business days. We ask that you follow-up with your pharmacy.

## 2024-04-14 NOTE — Telephone Encounter (Signed)
 03/22/24 30 tabs/1 RF- early fill request due to being out of town when due for fill

## 2024-04-15 ENCOUNTER — Telehealth: Payer: Self-pay

## 2024-04-15 NOTE — Telephone Encounter (Signed)
 Copied from CRM #[id number]. Topic: Clinical - Prescription Issue >> Apr 15, 2024  9:16 AM Wess RAMAN wrote: Reason for CRM: [place] and will run out of ALPRAZolam  (XANAX ) 1 MG tablet and needs an early refill before 7pm today.  Callback #: [phone]  Preferred Pharmacy: [facility] [id number] - [place], [place] - [address] [place] [address] Phone: [phone] Fax: [phone]

## 2024-04-15 NOTE — Telephone Encounter (Signed)
 This prescription was sent in yesterday, call the pharmacy to see if they received it

## 2024-04-21 ENCOUNTER — Ambulatory Visit: Admitting: Nurse Practitioner

## 2024-08-02 ENCOUNTER — Encounter: Payer: Self-pay | Admitting: Nurse Practitioner

## 2024-08-02 ENCOUNTER — Ambulatory Visit: Payer: Self-pay | Admitting: Nurse Practitioner

## 2024-08-02 VITALS — BP 110/70 | HR 76 | Temp 97.7°F | Ht 71.0 in | Wt 270.0 lb

## 2024-08-02 DIAGNOSIS — Z9889 Other specified postprocedural states: Secondary | ICD-10-CM | POA: Diagnosis not present

## 2024-08-02 DIAGNOSIS — E782 Mixed hyperlipidemia: Secondary | ICD-10-CM | POA: Diagnosis not present

## 2024-08-02 DIAGNOSIS — E669 Obesity, unspecified: Secondary | ICD-10-CM | POA: Diagnosis not present

## 2024-08-02 DIAGNOSIS — E1169 Type 2 diabetes mellitus with other specified complication: Secondary | ICD-10-CM

## 2024-08-02 DIAGNOSIS — Z6837 Body mass index (BMI) 37.0-37.9, adult: Secondary | ICD-10-CM | POA: Insufficient documentation

## 2024-08-02 DIAGNOSIS — H9312 Tinnitus, left ear: Secondary | ICD-10-CM | POA: Insufficient documentation

## 2024-08-02 DIAGNOSIS — E785 Hyperlipidemia, unspecified: Secondary | ICD-10-CM

## 2024-08-02 DIAGNOSIS — Z79899 Other long term (current) drug therapy: Secondary | ICD-10-CM | POA: Diagnosis not present

## 2024-08-02 DIAGNOSIS — E66812 Obesity, class 2: Secondary | ICD-10-CM | POA: Diagnosis not present

## 2024-08-02 DIAGNOSIS — I10 Essential (primary) hypertension: Secondary | ICD-10-CM | POA: Diagnosis not present

## 2024-08-02 LAB — LIPID PANEL
Chol/HDL Ratio: 3.4 ratio (ref 0.0–5.0)
Cholesterol, Total: 115 mg/dL (ref 100–199)
HDL: 34 mg/dL — ABNORMAL LOW (ref >39)
LDL Chol Calc (NIH): 64 mg/dL (ref 0–99)
Triglycerides: 85 mg/dL (ref 0–149)
VLDL Cholesterol Cal: 17 mg/dL (ref 5–40)

## 2024-08-02 LAB — CMP14+EGFR
ALT: 38 IU/L (ref 0–44)
AST: 25 IU/L (ref 0–40)
Albumin: 4.8 g/dL (ref 3.8–4.9)
Alkaline Phosphatase: 62 IU/L (ref 47–123)
BUN/Creatinine Ratio: 15 (ref 9–20)
BUN: 12 mg/dL (ref 6–24)
Bilirubin Total: 0.5 mg/dL (ref 0.0–1.2)
CO2: 24 mmol/L (ref 20–29)
Calcium: 9.5 mg/dL (ref 8.7–10.2)
Chloride: 99 mmol/L (ref 96–106)
Creatinine, Ser: 0.8 mg/dL (ref 0.76–1.27)
Globulin, Total: 2.2 g/dL (ref 1.5–4.5)
Glucose: 83 mg/dL (ref 70–99)
Potassium: 3.7 mmol/L (ref 3.5–5.2)
Sodium: 139 mmol/L (ref 134–144)
Total Protein: 7 g/dL (ref 6.0–8.5)
eGFR: 105 mL/min/1.73 (ref >59)

## 2024-08-02 LAB — HEMOGLOBIN A1C
Est. average glucose Bld gHb Est-mCnc: 111 mg/dL
Hgb A1c MFr Bld: 5.5 % (ref 4.8–5.6)

## 2024-08-02 MED ORDER — ALPRAZOLAM 1 MG PO TABS: ORAL_TABLET | ORAL | 5 refills | Status: AC

## 2024-08-02 NOTE — Patient Instructions (Signed)

## 2024-08-02 NOTE — Assessment & Plan Note (Signed)
 No changes, continue ativan as needed

## 2024-08-02 NOTE — Assessment & Plan Note (Signed)
 He is encouraged to strive for BMI less than 30 to decrease cardiac risk. Advised to aim for at least 150 minutes of exercise per week.

## 2024-08-02 NOTE — Assessment & Plan Note (Signed)
 Post-surgery on October 1st. Initial limping and stiffness improved. Swelling decreased. Knee brace caused stiffness and discomfort. - Discontinue knee brace if causing discomfort.

## 2024-08-02 NOTE — Assessment & Plan Note (Signed)
 Blood glucose levels ranged from 103 to 226 mg/dL. Last diabetic eye exam was in October 2022. - Recheck A1c. - Ensure diabetic eye exam is completed by year-end and obtain report.

## 2024-08-02 NOTE — Assessment & Plan Note (Signed)
 Blood pressure is well controlled, continue current medications.

## 2024-08-02 NOTE — Assessment & Plan Note (Signed)
 Chronic, stable. LDL not at goal of less than 70.

## 2024-08-02 NOTE — Progress Notes (Signed)
 I,[name], CMA,acting as a neurosurgeon for [employer], FNP.,have documented all relevant documentation on the behalf of [name], FNP,as directed by  [name], FNP while in the presence of [name], FNP.  Subjective:  Patient ID: [name] , male    DOB: 1970/09/22 , 53 y.o.   MRN: [id number]  Chief Complaint  Patient presents with   Diabetes    Patient presents today for a bp and dm check. Patient reports compliance with his meds. Patient denies having any chest pain,sob or headaches at this time. Patient doesn't have any questions or concerns at this time.   Hypertension    HPI Discussed the use of AI scribe software for clinical note transcription with the patient, who gave verbal consent to proceed.  History of Present Illness [name] is a 53 year old male with diabetes and hypertension who presents for a follow-up visit.  He has not had a diabetic eye exam this year, although he had one in October 2022.  He underwent right knee surgery on October 1st and has since returned to work. Initially, he experienced limping and stiffness upon resuming work, which gradually improved over the week. He wore a knee brace, which made his knee stiff and uncomfortable, leading him to discontinue its use. The swelling was localized to his knee post-surgery and has since decreased.  He has been monitoring his blood sugar levels, which have been ranging from 103 to 126 mg/dL. He does not require any medication refills at this time.  He is planning a trip to [place], with two days at [employer], indicating an active lifestyle.  Diabetes He presents for his follow-up diabetic visit. He has type 2 diabetes mellitus. There are no hypoglycemic associated symptoms. Pertinent negatives for hypoglycemia include no dizziness or headaches. Pertinent negatives for diabetes include no blurred vision, no polydipsia, no polyphagia and no polyuria. There are no hypoglycemic  complications. There are no diabetic complications. Risk factors for coronary artery disease include obesity and male sex. Current diabetic treatment includes oral agent (dual therapy). His weight is stable. He is following a generally healthy diet. When asked about meal planning, he reported none. He has not had a previous visit with a dietitian. He participates in exercise intermittently (recent knee surgery). (Blood sugar is 103-136. ) Eye exam is not current.     Past Medical History:  Diagnosis Date   Allergic rhinitis    Anal fissure    Anxiety    Chronic rhinitis 12/13/2020   Class 1 obesity due to excess calories with body mass index (BMI) of 33.0 to 33.9 in adult 03/05/2023   Depression    DIZZINESS 11/11/2007   With chronic tinnitus     Family history of adverse reaction to anesthesia    mother - hard awaken   Gastric polyp 03/08/2005   Benign   GERD (gastroesophageal reflux disease)    H. pylori infection    Headache(784.0)    Hyperlipidemia 06/04/2012   Hypertension    IBS (irritable bowel syndrome)    Insomnia    Lumbar pain 05/31/2020   Plantar fasciitis    PONV (postoperative nausea and vomiting)    Pre-diabetes      Family History  Problem Relation Age of Onset   Hypertension Mother    Diabetes Mother    Heart disease Mother    Lung cancer Father    Cancer Father    Colon cancer Neg Hx  Colon polyps Neg Hx    Crohn's disease Neg Hx    Esophageal cancer Neg Hx    Rectal cancer Neg Hx    Stomach cancer Neg Hx    Ulcerative colitis Neg Hx      Current Outpatient Medications:    Accu-Chek Softclix Lancets lancets, Use to check blood sugars once daily R73.03, Disp: 100 each, Rfl: 12   atorvastatin  (LIPITOR) 10 MG tablet, TAKE 1 TABLET BY MOUTH DAILY, Disp: 90 tablet, Rfl: 3   Blood Glucose Monitoring Suppl (ACCU-CHEK GUIDE) w/Device KIT, Check blood sugars once daily R73.03, Disp: 1 kit, Rfl: 1   desonide (DESOWEN) 0.05 % cream, Apply topically 2 (two)  times daily as needed., Disp: , Rfl:    Glucagon  (GVOKE HYPOPEN  2-PACK) 0.5 MG/0.1ML SOAJ, Inject 1 each into the skin as needed., Disp: 0.1 mL, Rfl: 3   glucose blood (ACCU-CHEK GUIDE) test strip, Use to check blood sugars once daily R73.03, Disp: 100 each, Rfl: 2   ipratropium (ATROVENT ) 0.03 % nasal spray, Place 2 sprays into the nose 3 (three) times daily., Disp: 30 mL, Rfl: 2   lisinopril -hydrochlorothiazide  (ZESTORETIC ) 20-25 MG tablet, TAKE 1 TABLET BY MOUTH DAILY, Disp: 90 tablet, Rfl: 3   metFORMIN  (GLUCOPHAGE -XR) 500 MG 24 hr tablet, Take 1 tablet (500 mg total) by mouth 2 (two) times daily., Disp: 90 tablet, Rfl: 3   omeprazole  (PRILOSEC ) 20 MG capsule, Take 1 capsule (20 mg total) by mouth daily., Disp: 90 capsule, Rfl: 3   OZEMPIC , 1 MG/DOSE, 4 MG/3ML SOPN, INJECT SUBCUTANEOUSLY 1 MG EVERY WEEK, Disp: 9 mL, Rfl: 3   ALPRAZolam  (XANAX ) 1 MG tablet, TAKE 1 TABLET BY MOUTH EVERY NIGHT AT BEDTIME AS NEEDED FOR TINNITUS OR SLEEP, Disp: 30 tablet, Rfl: 5   Allergies  Allergen Reactions   Hydrocodone     Headache, hypersensitivity     Review of Systems  Constitutional: Negative.   Eyes: Negative.  Negative for blurred vision.  Respiratory: Negative.    Cardiovascular: Negative.   Endocrine: Negative for polydipsia, polyphagia and polyuria.  Musculoskeletal: Negative.   Skin: Negative.   Neurological:  Negative for dizziness and headaches.  Psychiatric/Behavioral: Negative.       Today's Vitals   08/02/24 0813  BP: 110/70  Pulse: 76  Temp: 97.7 F (36.5 C)  TempSrc: Oral  Weight: 270 lb (122.5 kg)  Height: 5' 11 (1.803 m)  PainSc: 0-No pain   Body mass index is 37.66 kg/m.  Wt Readings from Last 3 Encounters:  08/02/24 270 lb (122.5 kg)  03/29/24 256 lb 3.2 oz (116.2 kg)  12/31/23 255 lb (115.7 kg)     Objective:  Physical Exam Vitals and nursing note reviewed.  Constitutional:      General: He is not in acute distress.    Appearance: Normal appearance.   Cardiovascular:     Rate and Rhythm: Normal rate and regular rhythm.     Pulses: Normal pulses.     Heart sounds: Normal heart sounds. No murmur heard. Pulmonary:     Effort: Pulmonary effort is normal. No respiratory distress.     Breath sounds: Normal breath sounds. No wheezing.  Musculoskeletal:        General: No swelling, tenderness or signs of injury.  Skin:    General: Skin is warm and dry.     Capillary Refill: Capillary refill takes less than 2 seconds.  Neurological:     General: No focal deficit present.     Mental Status:  He is alert and oriented to person, place, and time.     Cranial Nerves: No cranial nerve deficit.     Motor: No weakness.     Assessment And Plan:   Assessment & Plan Type 2 diabetes mellitus with hyperlipidemia (HCC) Blood glucose levels ranged from 103 to 226 mg/dL. Last diabetic eye exam was in October 2022. - Recheck A1c. - Ensure diabetic eye exam is completed by year-end and obtain report. Essential hypertension Blood pressure is well controlled, continue current medications  Mixed hyperlipidemia Chronic, stable. LDL not at goal of less than 70.  Tinnitus, left ear No changes, continue ativan as needed History of right knee surgery Post-surgery on October 1st. Initial limping and stiffness improved. Swelling decreased. Knee brace caused stiffness and discomfort. - Discontinue knee brace if causing discomfort. Class 2 severe obesity due to excess calories with serious comorbidity and body mass index (BMI) of 37.0 to 37.9 in adult He is encouraged to strive for BMI less than 30 to decrease cardiac risk. Advised to aim for at least 150 minutes of exercise per week. Other long term (current) drug therapy  Type 2 diabetes mellitus in patient with obesity (HCC)   Orders Placed This Encounter  Procedures   CMP14+EGFR   Hemoglobin A1c   Lipid panel    Return for controlled DM check-4 months and HM.   Patient was given opportunity to  ask questions. Patient verbalized understanding of the plan and was able to repeat key elements of the plan. All questions were answered to their satisfaction.    LILLETTE [name], FNP, have reviewed all documentation for this visit. The documentation on 08/02/24 for the exam, diagnosis, procedures, and orders are all accurate and complete.   IF YOU HAVE BEEN REFERRED TO A SPECIALIST, IT MAY TAKE 1-2 WEEKS TO SCHEDULE/PROCESS THE REFERRAL. IF YOU HAVE NOT HEARD FROM US /SPECIALIST IN TWO WEEKS, PLEASE GIVE US  A CALL AT [phone].

## 2024-08-09 ENCOUNTER — Ambulatory Visit: Payer: Self-pay | Admitting: Nurse Practitioner

## 2024-08-22 ENCOUNTER — Other Ambulatory Visit: Payer: Self-pay | Admitting: Nurse Practitioner

## 2024-08-22 DIAGNOSIS — E669 Obesity, unspecified: Secondary | ICD-10-CM

## 2025-01-04 ENCOUNTER — Encounter: Admitting: Nurse Practitioner
